# Patient Record
Sex: Male | Born: 1935 | Race: White | Hispanic: No | Marital: Married | Smoking: Never smoker
Health system: Southern US, Community
[De-identification: ages and names within clinical notes are randomized; demographics above are authoritative.]

## PROBLEM LIST (undated history)

## (undated) DIAGNOSIS — K5791 Diverticulosis of intestine, part unspecified, without perforation or abscess with bleeding: Secondary | ICD-10-CM

## (undated) DIAGNOSIS — D649 Anemia, unspecified: Secondary | ICD-10-CM

## (undated) DIAGNOSIS — C61 Malignant neoplasm of prostate: Secondary | ICD-10-CM

## (undated) DIAGNOSIS — R339 Retention of urine, unspecified: Secondary | ICD-10-CM

## (undated) DIAGNOSIS — N200 Calculus of kidney: Secondary | ICD-10-CM

## (undated) DIAGNOSIS — D494 Neoplasm of unspecified behavior of bladder: Secondary | ICD-10-CM

## (undated) DIAGNOSIS — I82409 Acute embolism and thrombosis of unspecified deep veins of unspecified lower extremity: Secondary | ICD-10-CM

## (undated) DIAGNOSIS — Z95828 Presence of other vascular implants and grafts: Secondary | ICD-10-CM

## (undated) DIAGNOSIS — R001 Bradycardia, unspecified: Secondary | ICD-10-CM

## (undated) DIAGNOSIS — F0390 Unspecified dementia without behavioral disturbance: Secondary | ICD-10-CM

## (undated) HISTORY — DX: Retention of urine, unspecified: R33.9

## (undated) HISTORY — DX: Malignant neoplasm of prostate: C61

## (undated) HISTORY — DX: Calculus of kidney: N20.0

## (undated) HISTORY — PX: BACK SURGERY: SHX140

## (undated) HISTORY — DX: Neoplasm of unspecified behavior of bladder: D49.4

---

## 1998-12-19 ENCOUNTER — Emergency Department (HOSPITAL_COMMUNITY): Admission: EM | Admit: 1998-12-19 | Discharge: 1998-12-19 | Payer: Self-pay | Admitting: Emergency Medicine

## 1998-12-19 ENCOUNTER — Encounter: Payer: Self-pay | Admitting: Emergency Medicine

## 1998-12-24 ENCOUNTER — Encounter: Admission: RE | Admit: 1998-12-24 | Discharge: 1998-12-24 | Payer: Self-pay | Admitting: Orthopedic Surgery

## 1998-12-24 ENCOUNTER — Encounter: Payer: Self-pay | Admitting: Orthopedic Surgery

## 1998-12-26 ENCOUNTER — Ambulatory Visit (HOSPITAL_BASED_OUTPATIENT_CLINIC_OR_DEPARTMENT_OTHER): Admission: RE | Admit: 1998-12-26 | Discharge: 1998-12-26 | Payer: Self-pay | Admitting: Orthopedic Surgery

## 2002-01-11 ENCOUNTER — Encounter: Payer: Self-pay | Admitting: Orthopedic Surgery

## 2002-01-13 ENCOUNTER — Encounter: Payer: Self-pay | Admitting: Orthopedic Surgery

## 2002-01-13 ENCOUNTER — Inpatient Hospital Stay (HOSPITAL_COMMUNITY): Admission: RE | Admit: 2002-01-13 | Discharge: 2002-01-18 | Payer: Self-pay | Admitting: Orthopedic Surgery

## 2002-04-19 ENCOUNTER — Encounter: Payer: Self-pay | Admitting: Orthopedic Surgery

## 2002-04-21 ENCOUNTER — Inpatient Hospital Stay (HOSPITAL_COMMUNITY): Admission: RE | Admit: 2002-04-21 | Discharge: 2002-04-25 | Payer: Self-pay | Admitting: Orthopedic Surgery

## 2002-04-21 ENCOUNTER — Encounter: Payer: Self-pay | Admitting: Orthopedic Surgery

## 2002-06-13 ENCOUNTER — Ambulatory Visit (HOSPITAL_COMMUNITY): Admission: RE | Admit: 2002-06-13 | Discharge: 2002-06-13 | Payer: Self-pay | Admitting: Ophthalmology

## 2002-08-15 ENCOUNTER — Ambulatory Visit (HOSPITAL_COMMUNITY): Admission: RE | Admit: 2002-08-15 | Discharge: 2002-08-15 | Payer: Self-pay | Admitting: Ophthalmology

## 2003-12-06 ENCOUNTER — Ambulatory Visit: Payer: Self-pay | Admitting: Oncology

## 2003-12-26 ENCOUNTER — Ambulatory Visit: Payer: Self-pay | Admitting: Oncology

## 2004-01-25 ENCOUNTER — Ambulatory Visit: Payer: Self-pay | Admitting: Oncology

## 2004-04-16 ENCOUNTER — Ambulatory Visit: Payer: Self-pay | Admitting: Oncology

## 2004-04-24 ENCOUNTER — Ambulatory Visit: Payer: Self-pay | Admitting: Oncology

## 2004-07-24 ENCOUNTER — Ambulatory Visit: Payer: Self-pay | Admitting: Oncology

## 2004-07-25 ENCOUNTER — Ambulatory Visit: Payer: Self-pay | Admitting: Oncology

## 2005-10-08 ENCOUNTER — Other Ambulatory Visit: Payer: Self-pay

## 2005-10-10 ENCOUNTER — Inpatient Hospital Stay: Payer: Self-pay | Admitting: Internal Medicine

## 2008-03-08 ENCOUNTER — Inpatient Hospital Stay: Payer: Self-pay | Admitting: Internal Medicine

## 2008-08-01 ENCOUNTER — Ambulatory Visit: Payer: Self-pay | Admitting: Unknown Physician Specialty

## 2008-11-24 ENCOUNTER — Ambulatory Visit: Payer: Self-pay | Admitting: Oncology

## 2008-12-10 ENCOUNTER — Inpatient Hospital Stay: Payer: Self-pay | Admitting: Internal Medicine

## 2008-12-25 ENCOUNTER — Ambulatory Visit: Payer: Self-pay | Admitting: Oncology

## 2009-01-23 ENCOUNTER — Ambulatory Visit: Payer: Self-pay | Admitting: Unknown Physician Specialty

## 2009-01-24 ENCOUNTER — Ambulatory Visit: Payer: Self-pay | Admitting: Internal Medicine

## 2009-01-25 ENCOUNTER — Inpatient Hospital Stay: Payer: Self-pay | Admitting: Unknown Physician Specialty

## 2009-02-09 ENCOUNTER — Inpatient Hospital Stay: Payer: Self-pay | Admitting: Student

## 2009-02-16 ENCOUNTER — Encounter: Payer: Self-pay | Admitting: Internal Medicine

## 2009-02-24 ENCOUNTER — Encounter: Payer: Self-pay | Admitting: Internal Medicine

## 2009-02-24 ENCOUNTER — Ambulatory Visit: Payer: Self-pay | Admitting: Internal Medicine

## 2009-03-27 ENCOUNTER — Encounter: Payer: Self-pay | Admitting: Internal Medicine

## 2009-07-30 ENCOUNTER — Ambulatory Visit: Payer: Self-pay | Admitting: Unknown Physician Specialty

## 2009-09-18 ENCOUNTER — Ambulatory Visit: Payer: Self-pay | Admitting: Urology

## 2009-10-02 ENCOUNTER — Ambulatory Visit: Payer: Self-pay | Admitting: Urology

## 2009-10-04 ENCOUNTER — Ambulatory Visit: Payer: Self-pay | Admitting: Urology

## 2009-10-18 ENCOUNTER — Ambulatory Visit: Payer: Self-pay | Admitting: Urology

## 2009-10-25 ENCOUNTER — Encounter: Payer: Self-pay | Admitting: Cardiovascular Disease

## 2009-10-25 ENCOUNTER — Ambulatory Visit: Payer: Self-pay | Admitting: Urology

## 2009-11-08 ENCOUNTER — Encounter: Payer: Self-pay | Admitting: Cardiovascular Disease

## 2009-11-08 ENCOUNTER — Ambulatory Visit: Payer: Self-pay | Admitting: Urology

## 2009-11-14 ENCOUNTER — Ambulatory Visit: Payer: Self-pay | Admitting: Cardiovascular Disease

## 2009-11-14 DIAGNOSIS — R42 Dizziness and giddiness: Secondary | ICD-10-CM

## 2009-11-14 DIAGNOSIS — I499 Cardiac arrhythmia, unspecified: Secondary | ICD-10-CM | POA: Insufficient documentation

## 2009-11-14 DIAGNOSIS — R0602 Shortness of breath: Secondary | ICD-10-CM | POA: Insufficient documentation

## 2009-11-15 ENCOUNTER — Encounter: Payer: Self-pay | Admitting: Cardiovascular Disease

## 2009-11-16 ENCOUNTER — Ambulatory Visit: Payer: Self-pay

## 2009-11-16 ENCOUNTER — Encounter: Payer: Self-pay | Admitting: Cardiovascular Disease

## 2009-11-27 ENCOUNTER — Encounter: Payer: Self-pay | Admitting: Cardiovascular Disease

## 2009-11-27 ENCOUNTER — Telehealth: Payer: Self-pay | Admitting: Cardiovascular Disease

## 2009-11-28 ENCOUNTER — Encounter: Payer: Self-pay | Admitting: Cardiovascular Disease

## 2009-11-30 ENCOUNTER — Telehealth: Payer: Self-pay | Admitting: Cardiovascular Disease

## 2010-03-28 NOTE — Procedures (Signed)
Summary: Holter and Event  Holter and Event   Imported By: Harlon Flor 02/28/2010 10:57:26  _____________________________________________________________________  External Attachment:    Type:   Image     Comment:   External Document

## 2010-03-28 NOTE — Letter (Signed)
Summary: [place] [employer] [facility], [facility] at [facility] [address]   [place], [place] [address]   Phone: [phone]  Fax: [phone]      November 28, 2009 MRN: [id number]   Cody Gordon [address] [place], [place]  [address]   Dear [name],  Your test ordered by Selena Batten has been reviewed by your physician (or physician assistant) and was found to be normal or stable. Your physician (or physician assistant) felt no changes were needed at this time.  __x__ Echocardiogram  ____ Cardiac Stress Test  ____ Lab Work  ____ Peripheral vascular study of arms, legs or neck  ____ CT scan or X-ray  ____ Lung or Breathing test  ____ Other:   Thank you.   Benedict Needy, RN    Dossie Arbour, MD

## 2010-03-28 NOTE — Assessment & Plan Note (Signed)
Summary: NP6/AMD   Visit Type:  Initial Consult Referring Yaneli Keithley:  Arlys Jaking Cope,M.D.  CC:  Establish care with a cardiologist.  "During a second lithotripsy and he had a significant sustained run of ventricular tachycardia".  He needs to have a stent removal..  History of Present Illness: 77 year old male, patient of Dr. Achilles Dunk with a history of kidney stones with recent arrhythmia noted during lithotripsy who presents for evaluation.  Notes from Dr. Achilles Dunk indicate that he had a run of ventricular tachycardia, then had bradycardia alternating with tachycardia during the procedure. He currently has a stent in his ureter with no significant discomfort.  At baseline, Mr. Smoak reports that he has episodes of dizziness over the past one to 2 weeks. Sometimes they happen with lying down, sometimes with getting up. He has to sit on the side of the bed for at least a minute for his symptoms to resolve. He reports having frequent falls as his legs give out. He has had worsening shortness of breath since his back surgery. He also states that his blood pressure has been running very low and is not on any medications. He denies any significant lower extremity edema or cough.  EKG shows normal sinus rhythm with rate of 69 beats per minute, right bundle branch block, no significant ST or T wave changes, APCs were noted  Preventive Screening-Counseling & Management  Alcohol-Tobacco     Smoking Status: never  Caffeine-Diet-Exercise     Does Patient Exercise: no  Current Medications (verified): 1)  None  Allergies (verified): No Known Drug Allergies  Past History:  Social History: Last updated: 11/14/2009 Retired  Married  Tobacco Use - No.  Alcohol Use - no Regular Exercise - no  Risk Factors: Exercise: no (11/14/2009)  Risk Factors: Smoking Status: never (11/14/2009)  Past Medical History: neoplasm,bladder prostate cancer tachycardia-bradycardia nephrolithiasis incomplete  emptying hematuria  Past Surgical History: back surgery  Social History: Retired  Married  Tobacco Use - No.  Alcohol Use - no Regular Exercise - no Smoking Status:  never Does Patient Exercise:  no  Review of Systems       The patient complains of dyspnea on exertion and difficulty walking.  The patient denies fever, weight loss, weight gain, vision loss, decreased hearing, hoarseness, chest pain, syncope, peripheral edema, prolonged cough, abdominal pain, incontinence, muscle weakness, depression, and enlarged lymph nodes.         near syncope, dizzy epsiodes  Vital Signs:  Patient profile:   77 year old male Height:      67 inches Weight:      171 pounds BMI:     26.88 Pulse rate:   69 / minute BP sitting:   100 / 78  (left arm) Cuff size:   regular  Vitals Entered By: Bishop Dublin, CMA (November 14, 2009 3:57 PM)  Physical Exam  General:  elderly gentleman in no apparent distress, walks with a cane. Head:  normocephalic and atraumatic Eyes:  PERRLA/EOM intact; conjunctiva and lids normal. Neck:  Neck supple, no JVD. No masses, thyromegaly or abnormal cervical nodes. Lungs:  Clear bilaterally to auscultation and percussion. Heart:  Non-displaced PMI, chest non-tender; regular rate and rhythm, S1, S2 with I-II/VI SEM RSB,  no rubs or gallops. Carotid upstroke normal, no bruit.  Pedals normal pulses. No edema, no varicosities. Abdomen:  abdomen soft and non-tender without masses Msk:  Back normal, normal gait. Muscle strength and tone normal. Pulses:  pulses normal in all 4 extremities Extremities:  No clubbing or cyanosis. Neurologic:  Alert and oriented x 3. Skin:  Intact without lesions or rashes. Psych:  Normal affect.   Impression & Recommendations:  Problem # 1:  CARDIAC ARRHYTHMIA (ICD-427.9) by report, he had a ventricular rhythm and then tachycardia with bradycardia during his lithotripsy procedure. The telemetry strips are not available for [place] to  review at this time. On auscultation in the clinic, he did have periods of bradycardia that seemed quite pronounced. We have ordered a Holter monitor for 48 hours and an echocardiogram to rule out structural disease. If his echocardiogram and Holter appear relatively benign, we will continue to watch him if symptoms persist, we may need an event monitor to run over a longer period of time.  I've encouraged him to stay hydrated as his blood pressure is borderline low and this may be contributing to some dizziness.  Orders: Echocardiogram (Echo) Holter (Holter)  Problem # 2:  SHORTNESS OF BREATH (ICD-786.05) Etiology of his shortness of breath is uncertain. Echocardiogram has been ordered to evaluate his function. No stress test has been ordered at this time.  Orders: Echocardiogram (Echo) Holter (Holter)  Problem # 3:  DIZZINESS (ICD-780.4) It is uncertain if his dizziness is due to arrhythmia or hypotension or some other etiology. We have asked him to stay hydrated, we will check his Holter monitor.  Orders: Echocardiogram (Echo) Holter (Holter)  Patient Instructions: 1)  Your physician recommends that you schedule a follow-up appointment in: 3 months  2)  Your physician has requested that you have an echocardiogram.  Echocardiography is a painless test that uses sound waves to create images of your heart. It provides your doctor with information about the size and shape of your heart and how well your heart's chambers and valves are working.  This procedure takes approximately one hour. There are no restrictions for this procedure. 3)  Your physician has recommended that you wear a holter monitor.  Holter monitors are medical devices that record the heart's electrical activity. Doctors most often use these monitors to diagnose arrhythmias. Arrhythmias are problems with the speed or rhythm of the heartbeat. The monitor is a small, portable device. You can wear one while you do your normal  daily activities. This is usually used to diagnose what is causing palpitations/syncope (passing out).  Appended Document: NP6/AMD ECHO is essentially benign. Holter shows periods of sinus tachycardia. Unable to start b-blocker as his blood pressure was low in the clinic. OK to proceed to stent removal by Dr. Achilles Dunk. Dr. Achilles Dunk could use low doses of metoprolol IV 2.5 to 5 mg for arrhythmia during procedure.   Appended Document: NP6/AMD note faxed to Dr. Wynn Maudlin office.

## 2010-03-28 NOTE — Letter (Signed)
Summary: Patient Receipt of NPP  Patient Receipt of NPP   Imported By: Marylou Mccoy 11/27/2009 13:54:27  _____________________________________________________________________  External Attachment:    Type:   Image     Comment:   External Document

## 2010-03-28 NOTE — Progress Notes (Signed)
Summary: STINT  Phone Note From Other Clinic Call back at [phone]   Caller: BRANDY FROM [facility] Call For: [facility] Summary of Call: [facility] WANTS TO KNOW IF THEY CAN GO AHEAD AND REMOVE THE PT'S STINT-PT IS IN THE OFFICE NOW Initial call taken by: Harlon Flor,  November 27, 2009 10:09 AM  Follow-up for Phone Call        note faxed to [facility] at [facility] 10/4  Follow-up by: Benedict Needy, RN,  November 27, 2009 10:59 AM

## 2010-03-28 NOTE — Letter (Signed)
Summary: [facility]  [facility]   Imported By: Harlon Flor 11/12/2009 16:26:40  _____________________________________________________________________  External Attachment:    Type:   Image     Comment:   External Document

## 2010-03-28 NOTE — Op Note (Signed)
Summary: Lithoptripsy  Lithoptripsy   Imported By: Harlon Flor 11/12/2009 15:21:54  _____________________________________________________________________  External Attachment:    Type:   Image     Comment:   External Document

## 2010-03-28 NOTE — Progress Notes (Signed)
Summary: Holter  Phone Note Outgoing Call   Call placed by: Benedict Needy, RN,  November 30, 2009 11:37 AM Call placed to: Patient Summary of Call: Called to make pt aware of holter results. Dr. Mariah Milling would like for pt to start monitoring BP's and HR's and call on monday. IF Bp's and HR can tolerate it he may be started on beta blocker for the day time tachycardia. Pt will call on monday with recordings over the weekend.  Initial call taken by: Benedict Needy, RN,  November 30, 2009 11:38 AM

## 2010-03-28 NOTE — Letter (Signed)
Summary: [facility] Office Visit Note   [facility] Office Visit Note   Imported By: Roderic Ovens 12/18/2009 09:20:23  _____________________________________________________________________  External Attachment:    Type:   Image     Comment:   External Document

## 2010-07-12 NOTE — Discharge Summary (Signed)
NAME:  Cody Gordon, Cody Gordon NO.:  [id number]   MEDICAL RECORD NO.:  [id number]                   PATIENT TYPE:  INP   LOCATION:  5009                                 FACILITY:  [facility]   PHYSICIAN:  Loreta Ave, M.D.              DATE OF BIRTH:  11-22-1920   DATE OF ADMISSION:  04/21/2002  DATE OF DISCHARGE:  04/25/2002                                 DISCHARGE SUMMARY   ADMISSION DIAGNOSIS:  Advanced degenerative joint disease of the right knee.   DISCHARGE DIAGNOSES:  1. Advanced degenerative joint disease of the right knee.  2. Hypertension.   PROCEDURE:  Right total knee replacement.   HISTORY:  A 77 year old male status post left total knee replacement with  good results.  He is now having pain and difficulty with his right knee.  Activity has worsened his pain.  He is having difficulties with his ADL's.  He was seen today and admitted for right total hip replacement.   HOSPITAL COURSE:  A 77 year old male admitted April 21, 2002 and after  appropriate laboratory studies were obtained, as well as 1 g of Ancef IV on-  call to the operating room, was taken to the operating room where he  underwent a right total knee replacement.  He tolerated the procedure well.  He was placed on an epidural for postoperative pain management.  Heparin  5000 units subcu q.12h. was started until his Coumadin became therapeutic.  Ancef 1 g IV q.8h. for three doses.  Consultation with PT, OT, and rehab  were made.  Ambulation and weightbearing as tolerated on the right.  CPM 0-  30 degrees eight hours per day and increased by 10 degrees per day.  His IV  fluids and Foley were discontinued on postoperative day #2.  Epidural was  also discontinued.  He did have problems with constipation and bisacodyl was  used for this management.  The remainder of his hospital course was  uneventful and he was actually discharged in improved condition on March 1,  to return back  to the office in 10 days for followup.  Radiographic studies  of April 21, 2002 reveal right knee with total knee replacement  prosthesis in expected position.  No evidence of fracture or dislocation.  Laboratory studies reveal a hemoglobin of 14.9, hematocrit 43.5%, white  count is 5600, platelets 209,000.  Discharge hemoglobin 11.9, hematocrit  33.7%.  Prothrombin time at time of discharge was 18.3 with an INR of 1.6.  Preop chemistries:  Sodium 140, potassium 3.8, chloride 107, CO2 25, glucose  102, BUN 13, creatinine 0.9, calcium 9.6, total protein 6.9, albumin 4.2,  AST 39, ALT 31, ALP 96, and total bilirubin 0.6.  Discharge sodium 138,  potassium 3.4, chloride 102, CO2 29, glucose 122, BUN 8, creatinine 0.7,  calcium 9.  Urinalysis revealed color red and turbid with large amount of  hemoglobin and moderate  bilirubin.  Ictotest was negative.  Protein was 100.  He had many epithelials, white 7-10, red too numerous to count, and bacteria  none seen.   DISCHARGE MEDICATIONS:  1. He was given a prescription for Percocet 5/325 one to two tablets q.4h.     p.r.n. pain.  2. Coumadin 5 mg as directed by Genevieve Norlander.  3. Colace 100 mg b.i.d.  4. Iron sulfate 325 mg b.i.d.  5. Lovenox 40 mg injection daily on Tuesday, Wednesday, and Thursday.   ACTIVITY:  As tolerated and physical therapy.  Follow total knee replacement  protocol.   DIET:  No restrictions on his diet.   WOUND CARE:  Change his dressing daily.  Keep the wound clean and dry.   DISCHARGE INSTRUCTIONS:  Call if he has any difficulty with increased  temperature, drainage, redness, or pain.   FOLLOW UP:  Follow up back up in the office in 10-14 days for a recheck  evaluation.  Also of note is that he did have prostate carcinoma accounting  for the urinalysis.  He is under the care of a urologist.     Oris Drone. Petrarca, P.A.-C.                Loreta Ave, M.D.    BDP/MEDQ  D:  05/30/2002  T:  05/31/2002  Job:   [id number]

## 2010-07-12 NOTE — Op Note (Signed)
   NAME:  Cody Gordon, Cody Gordon NO.:  [id number]   MEDICAL RECORD NO.:  [id number]                   PATIENT TYPE:  INP   LOCATION:  2550                                 FACILITY:  [facility]   PHYSICIAN:  Burna Forts, M.D.             DATE OF BIRTH:  [birth year]   DATE OF PROCEDURE:  04/21/2002  DATE OF DISCHARGE:                                 OPERATIVE REPORT   PREOPERATIVE DIAGNOSIS:  Degenerative joint disease of the knee.   POSTOPERATIVE DIAGNOSIS:  Degenerative joint disease of the knee.   OPERATION PERFORMED:  Right total knee replacement.   ANESTHESIA PROCEDURE:  Placement of epidural catheter for postoperative  analgesia.   DESCRIPTION OF PROCEDURE:  Preoperatively the risks and benefits of  placement of epidural catheter for postoperative analgesia were discussed  with the patient in detail including alternatives for pain control.  It has  been requested by Loreta Ave, M.D., that we placed this for his  postoperative analgesia care.  The patient consented to the placement of the  epidural catheter.   At the end of the operative procedure, the patient was turned to the left  lateral decubitus position.  A sterile prep of the lumbar area was conducted  in the area just above his previous lumbar laminectomy incision.  There was  a free space there at about the L2-3 level. With a paramedian approach with  a 17 gauge Tuohy needle, the epidural space was contacted with a loss of  resistance technique and a catheter threaded with ease approximately 3 to 4  cm beyond the needle tip and the needle was removed.  After negative  aspiration for both heme and CSF, the catheter was injected with a total of  6 ml of 0.25% Marcaine containing 100 mcg of fentanyl.  The catheter was  secured in place with tape, the patient turned supine, extubated and  transferred to the PACU.   COMPLICATIONS:  None.     Burna Forts, M.D.    JTM/MEDQ  D:  04/21/2002  T:  04/21/2002  Job:  [id number]

## 2010-07-12 NOTE — Op Note (Signed)
NAME:  Cody Gordon, Cody Gordon NO.:  [id number]   MEDICAL RECORD NO.:  [id number]                   PATIENT TYPE:  INP   LOCATION:  2550                                 FACILITY:  [facility]   PHYSICIAN:  Loreta Ave, M.D.              DATE OF BIRTH:  Dec 11, 1920   DATE OF PROCEDURE:  04/21/2002  DATE OF DISCHARGE:                                 OPERATIVE REPORT   PREOPERATIVE DIAGNOSIS:  End-stage degenerative arthritis right knee with  valgus alignment and lateral patellofemoral tracking.   POSTOPERATIVE DIAGNOSIS:  End-stage degenerative arthritis right knee with  valgus alignment and lateral patellofemoral tracking.   PROCEDURE:  Right total knee replacement utilizing Osteonix prosthesis, a  posterior cemented microstructured posterior stabilizer #7 femoral  component, cemented #9 tibial component with 10-mm PS polyethylene flex  insert, cemented recessed __________ 26 mm patellar component, soft tissue  balancing including lateral retinacular release.   SURGEON:  Loreta Ave, M.D.   ASSISTANT:  Arlys Aceson D. Petrarca, P.A.-C.   ANESTHESIA:  General.   ESTIMATED BLOOD LOSS:  Minimal.   TOURNIQUET TIME:  1 hour, 20 minutes.   SPECIMENS:  Excised bone with soft tissue. No cultures.   COMPLICATIONS:  None.   DRESSINGS:  Soft compressive with knee immobilizer.   DRAINS:  Hemovac x2.   PROCEDURE:  The patient was brought to the operating room and placed on the  operating table in the supine position. After adequate anesthesia had been  obtained the right knee was examined. Excessive valgus more than 10 degrees  correctable to about 7 degrees. Pseudolaxity from bony deficiency laterally.  Almost full extension with 130 degrees of flexion. Lateral patellofemoral  tracking and tethering. The tourniquet was applied. Prepped and draped in  the usual sterile fashion. Exsanguination with elevation of the Esmarch  tourniquet inflated to 350 mmHg.   A  straight incision above the patella down to the tibial tubercle. The skin  and subcutaneous tissues were divided. Hemostasis with cautery. A medial  parapatellar arthrotomy. Medial and lateral capsular releases to achieve a  balanced knee. Remnants of menisci and cruciate ligaments excised. Grade 4  changes worse on the lateral compartment.   The distal femur was exposed. The intramedullary guide was placed. Distal  cut and set at 5 degrees of valgus, removing 10 mm. Most of the bony  resection on the medial side. Sized for a #7 component.   The jig was put in place, definitive cuts made. Proximal tibia exposed with  appropriate retractors. The tibial spine was removed with a saw. Sized to a  #9 component. Proximal cut removing 4 to 5 mm off the lowest side which was  actually medial. A 5 degree posterior slope cut. Patella sized, reamed and  drilled for a 26-mm component. All jigs were removed.   The trial was put in place. A #7 on the femur, a #9 on the  tibia, a 26 on  the patella. With a 10-mm flexed insert and appropriate marking of rotation  on the tibial component which was then hand reamed, full extension, full  flexion, nicely balanced knee without liftoff on flexion. Lateral  patellofemoral tracking tethering. Lateral release performed with cautery  inside out.   After this had acceptable patellofemoral tracking all trials were removed.  Copious irrigation with a pulse irrigating device. Cement prepared and  placed on the tibial component which was hammered in place. Polyethylene  attached and securely fit.   The femoral components seated with posterior cement. Excessive cement  removed. Patellar component cemented in place in the patella. All excessive  cement removed. Copious irrigation with saline and antibiotic solution.   Once the cement had hardened the knee was reexamined. Good motion, good  stability, good patellofemoral tracking. A Hemovac was placed and brought   out through a separate stab wound.   The arthrotomy was closed with #1 Vicryl. The skin and subcutaneous tissues  with Vicryl with staples. The margins of the wound and knee injected with  Marcaine and Hemovac clamped. A sterile compressive dressing was applied.  The tourniquet was deflated and removed. The knee immobilizer was applied.   Anesthesia was reversed. Brought to the recovery room. Tolerated the surgery  well without complications.                                               Loreta Ave, M.D.    DFM/MEDQ  D:  04/21/2002  T:  04/21/2002  Job:  [id number]

## 2010-07-12 NOTE — Op Note (Signed)
NAME:  Cody Gordon, PACKETT NO.:  [id number]   MEDICAL RECORD NO.:  [id number]                   PATIENT TYPE:  INP   LOCATION:  2550                                 FACILITY:  [facility]   PHYSICIAN:  Daphene Calamity                       DATE OF BIRTH:  10-Jan-1921   DATE OF PROCEDURE:  01/13/2002  DATE OF DISCHARGE:                                 OPERATIVE REPORT   PREOPERATIVE DIAGNOSIS:  End-stage degenerative arthritis with valgus  alignment, left knee.   POSTOPERATIVE DIAGNOSIS:  End-stage degenerative arthritis with valgus  alignment, left knee.   OPERATIVE PROCEDURE:  Left total knee replacement with appropriate soft  tissue balancing including lateral retinacular release.  Press fit posterior  stabilizing, #7 femoral component.  Cemented #9 tibial component with 12 mm  posterior stabilizing flex polyethylene insert.  Cemented recess, non-metal  back 26 mm patellar component.   SURGEON:  Mckinley Jewel, M.D.   ASSISTANT:  Arlys Youssouf D. Petrarca, P.A.-C.   ANESTHESIA:  General.   BLOOD LOSS:  Minimal.   TOURNIQUET TIME:  One hour and 10 minutes.   SPECIMENS:  Excised bone and soft tissue.   CULTURES:  None.   COMPLICATIONS:  None.   DRESSINGS:  Sterile compressive with knee immobilizer.   DRAINS:  Hemovac times two.   DESCRIPTION OF PROCEDURE:  The patient was brought to the operating room and  placed on the operating room table in the supine position.  After adequate  anesthesia had been obtained, left knee examined.  Excessive valgus, almost  15 degrees correctable to about 7 degrees.  Stable ligaments.  Mild flexion  contracture.  Forward flexion 130 degrees.  Lateral patellar femoral  tracking and tethering.  Tourniquet applied, prepped and draped in the usual  sterile fashion.  Exsanguinated with elevation of Esmarch, tourniquet  inflated to 350 mmHg.  Stab incision above the patella down to the tibial  tubercle.  Medial parapatellar  arthrotomy, hemostasis with cautery.  Knee  exposed.  Grade 4 changes throughout.  Partial tearing anterior aspect deep  MCL off the femur, chronic.  Remnants of menisci, cruciate ligaments all  debrided.  Soft tissue release laterally to achieve the balanced knee,  including mostly intra-articular release.  Distal femur exposed.  Intramedullary guide placed. Distal cut 5 degrees of valgus removing 10 mm  although bone loss laterally, I still could use this approach.  Distal femur  sized and fitted with a #7 posterior stabilizing component after appropriate  cuts with appropriate jigs.  All spurs were removed including posterior  recesses.  Trial put in place and found to fit well, trial removed.  Tibia  exposed.  Tibial spine removed with saw, sized to #9 component.  Intramedullary guide placed.  A 5 degree posterior cut removed, 4 mm off the  lateral side. Once complete, the patella was then sized, reamed  and drilled  for a 26 mm component, trial put in place.  A #7 on the femur, #9 on the  tibia, 26 on the patella with the 12 mm insert on the tibia, had full  extension, full flexion, no lift off.  Nicely balanced knee after  appropriate soft tissue balancing.  Tibia was marked for appropriate  rotation and head reamed.  Patellofemoral tracking was assessed before  removal of trials and lateral release performed to balance patellofemoral  joint which was much improved in terms of tracking after lateral release.  All trials removed.  Copious irrigation with a pulse irrigating device.  Tibial component was cemented in place and polyethylene attached.  Femoral  components seated.  Knee reduced.  Wound irrigated.  Patellar component  cemented down into patella.  Once all extensive cement had been removed and  cement had hardened, the knee was re-examined.  Full extension, full  flexion, nicely balanced knee, set at 5 degrees of valgus.  Reasonable  patellofemoral tracking.  Hemovacs placed  and brought out through separate  stab wounds.  Arthrotomy closed with #1 Vicryl in the skin and subcutaneous  tissue with Vicryl and staples.  Once the wound was closed, the knee was  injected with Marcaine.  Hemovacs clamped.  Sterile compressive dressing  applied after tourniquet deflated and removed.  Knee immobilizer applied.  Anesthesia reversed, brought to recovery room.  Tolerated the surgery well,  no complications.                                               Daphene Calamity    JP/MEDQ  D:  01/13/2002  T:  01/13/2002  Job:  [id number]

## 2010-07-12 NOTE — Discharge Summary (Signed)
NAME:  Cody Gordon, Cody Gordon NO.:  [id number]   MEDICAL RECORD NO.:  [id number]                   PATIENT TYPE:  INP   LOCATION:  5032                                 FACILITY:  [facility]   PHYSICIAN:  Loreta Ave, M.D.              DATE OF BIRTH:  1920/10/14   DATE OF ADMISSION:  01/13/2002  DATE OF DISCHARGE:  01/18/2002                                 DISCHARGE SUMMARY   ADMISSION DIAGNOSIS:  Advanced degenerative joint disease of the left knee.   DISCHARGE DIAGNOSES:  1. Advanced degenerative joint disease of the left knee.  2. History of  hypertension.  3. Hypokalemia.   PROCEDURE:  Left total knee replacement.   HISTORY:  This 77 year old white male with advanced degenerative joint  disease of the left knee.  He continues with popping and deep sharp pain in  the left knee.  He has failed with conservative treatment.  He has end stage  radiographic degenerative joint disease.  He is now indicated for left total  knee replacement.   HOSPITAL COURSE:  This 77 year old male admitted 01/13/2002 after  appropriate laboratory studies were obtained as well as 1 gram of Ancef  intravenously on call to the operating room.  He was taken to the operating  room where he underwent a left total knee replacement.  A Foley catheter was  placed preoperatively.  He was placed on a Dilaudid pump for postoperative  pain management.  Ancef was continued at 1 gram intravenously q8h times  three doses.  Heparin 5,000 units subcu q12 hours was started until his  Coumadin became therapeutic.  Consultation with PT, OT, and rehabilitation  were made.  Ambulation with weight bearing as tolerated on the left.  CPM 0-  30 degrees for eight hours per day.  Postop day two his Foley catheter was  discontinued and his intravenous was Hep-Lock.  He was placed on potassium  20 mEq p.o. b.i.d. for one day.  The remainder of his hospital course was  uneventful except for  constipation and his hypokalemia.  This was corrected  by the time of his discharge.  He was discharged on the 25th to return back  to the office in ten days for followup.   LABORATORY DATA:  EKG showed normal sinus rhythm.  Radiographic studies on  01/13/2002:  Left knee reveals anatomic alignment status post left total  knee arthroplasty without complications  Chest x-ray revealed no active  disease.  Laboratory studies:  Admitted with a hemoglobin 17.2, hematocrit  49.5%, white count 8,800 platelet count 228,000, discharge hemoglobin 13.4,  hematocrit 39.65, white count 8,900, platelet count 205,000.  Discharge pro-  time was 24.4, with INR 2.6.  Preop chemistries:  Sodium 139, potassium 4.0,  chloride 100, CO2 29, glucose 98, BUN 14, creatinine 0.9, calcium 10.1,  total protein 7.5, albumin 4.4, AST 31, ALT 31, ALP 108, total bilirubin  0.8.  Discharge sodium 134, potassium 3.4, chloride 92, CO2 31, glucose 148,  BUN 19, creatinine 0.9, and calcium 92.  Urinalysis revealed moderate blood  with too numerous to count red cells.  White count was 3-6 only.  Trace  leucocyte esterase.  Bacteria none seen.   DISCHARGE INSTRUCTIONS:  1. He was given a prescription for Percocet 5/325 one to two tablets every     four hours as needed for pain.  2. Coumadin 5 mg as directed by Genevieve Norlander pharmacist.  3. Colace 100 mg one tablet twice a day.  4. Iron sulfate 325 mg one tablet daily.  5. Activities as tolerated with physical therapy.  6. No restrictions in his diet.  7. Keep his wound clean and dry.  8. Call if he has any problems with his wound or increase in temperature,     pain, drainage or swelling.  9. He will follow back up with [place] in ten days for recheck evaluation.     Oris Drone Petrarca, P.A.-C.                Loreta Ave, M.D.    BDP/MEDQ  D:  03/13/2002  T:  03/14/2002  Job:  [id number]

## 2010-09-18 ENCOUNTER — Encounter: Payer: Self-pay | Admitting: Cardiovascular Disease

## 2011-10-02 IMAGING — CT CT CHEST W/ CM
1 series · 15 of 32 positions shown, 19 images · non-contrast
Comparison: None

REASON FOR EXAM: CHEST PAIN
COMMENTS:

PROCEDURE:     CT  - CT CHEST (FOR PE) W  - December 09, 2008 [DATE]
RESULT:     Indications: Chest pain
TECHNIQUE: A thin-section spiral CT from the lung apices to the upper
abdomen was acquired on a multi slice scanner following 100 ml Msovue-2L5
intravenous contrast. These images were then transferred to the Siemens work
station and were subsequently reviewed utilizing 3-D reconstructions and MIP
images.

[Series 4: soft tissue · axial · 0.77mm/px · z∈[-520,-268]mm · 15 of 96 slices shown, 19 images]
[im 8/96  mediastinal]
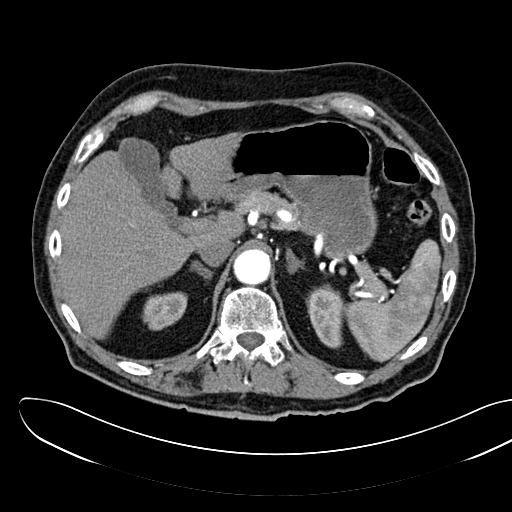
[im 8/96  lung]
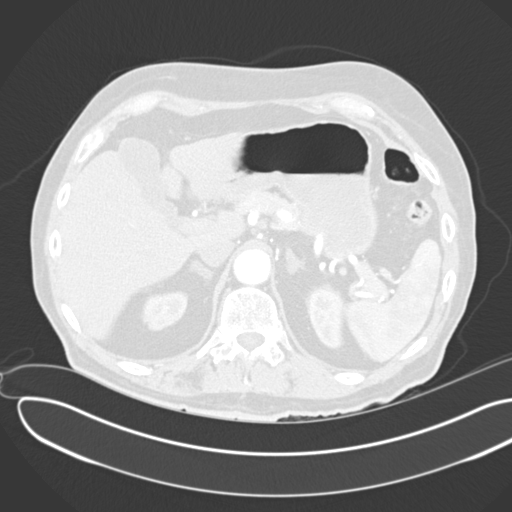
[im 15/96  lung]
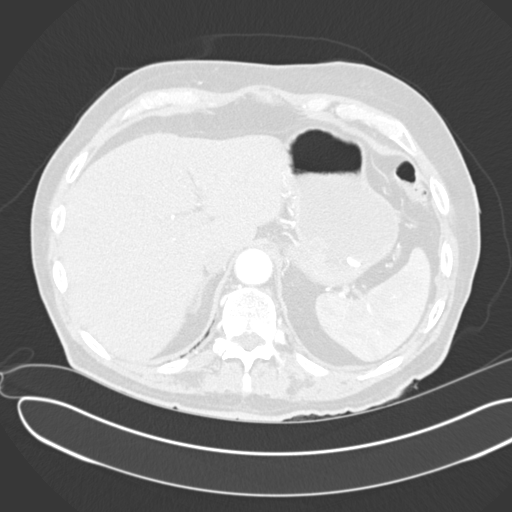
[im 20/96  lung]
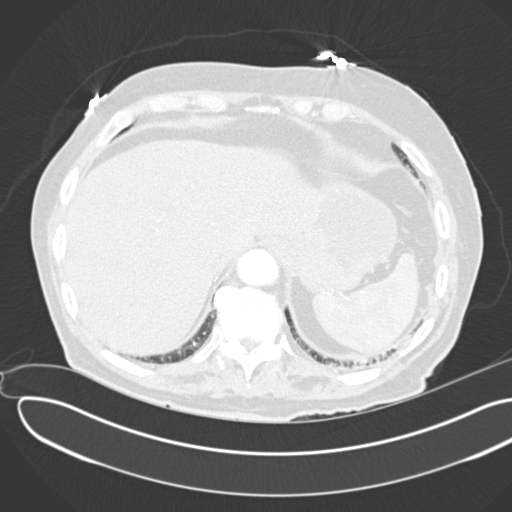
[im 25/96  lung]
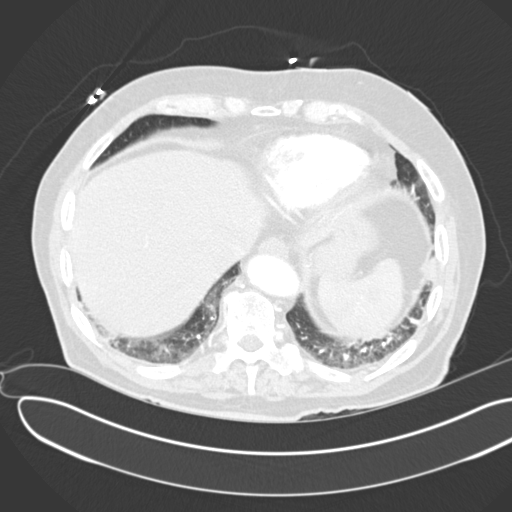
[im 32/96  mediastinal]
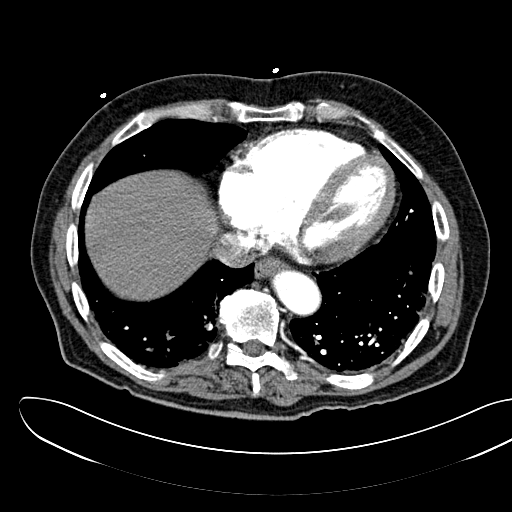
[im 32/96  lung]
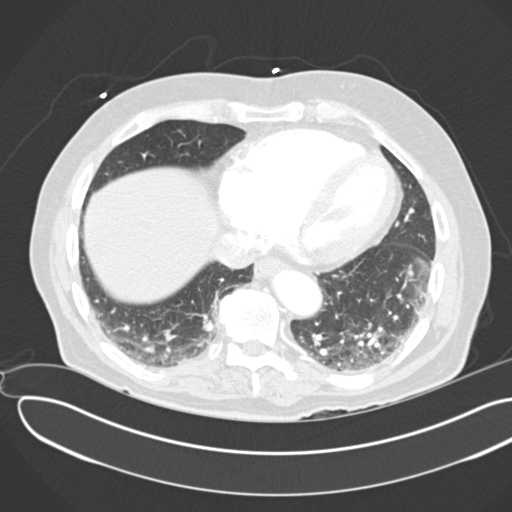
[im 39/96  lung]
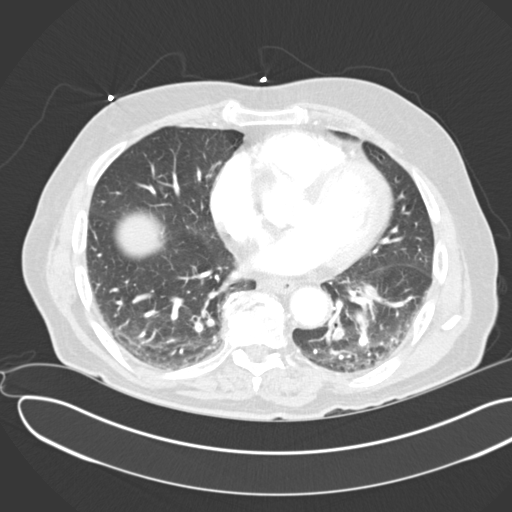
[im 46/96  lung]
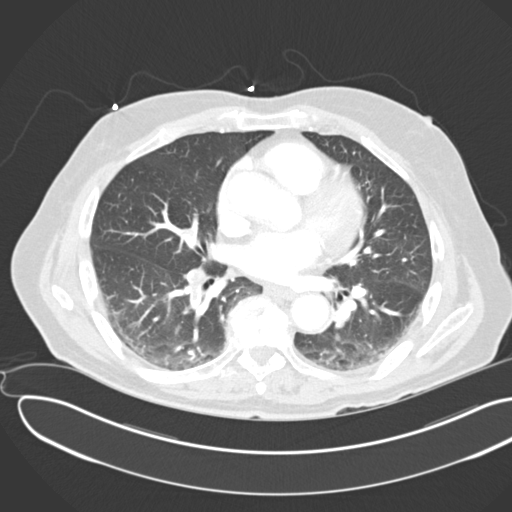
[im 51/96  lung]
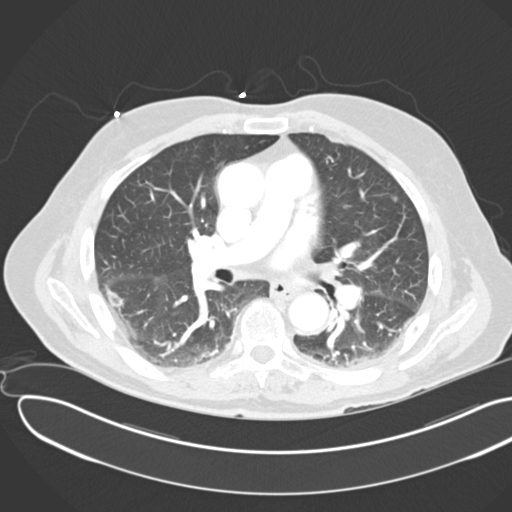
[im 57/96  mediastinal]
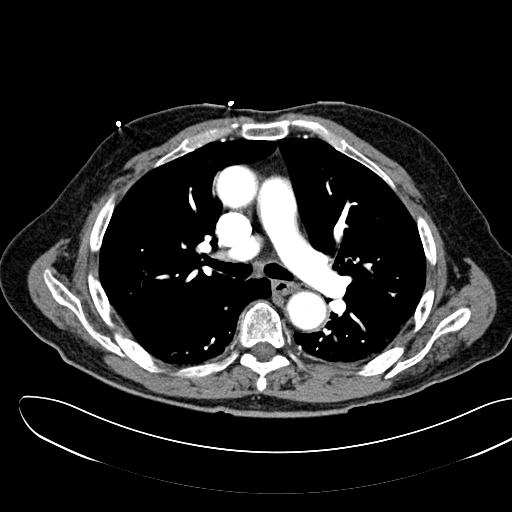
[im 57/96  lung]
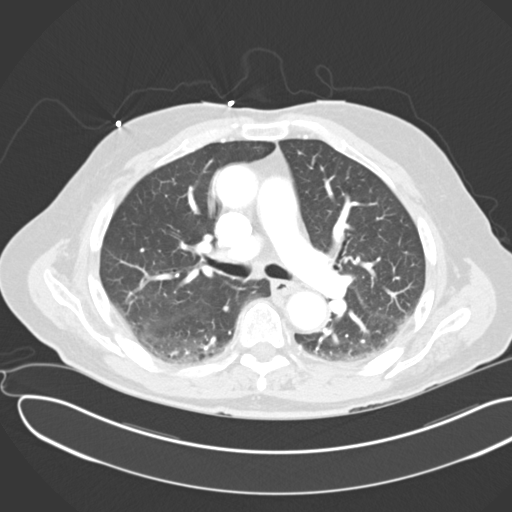
[im 60/96  lung]
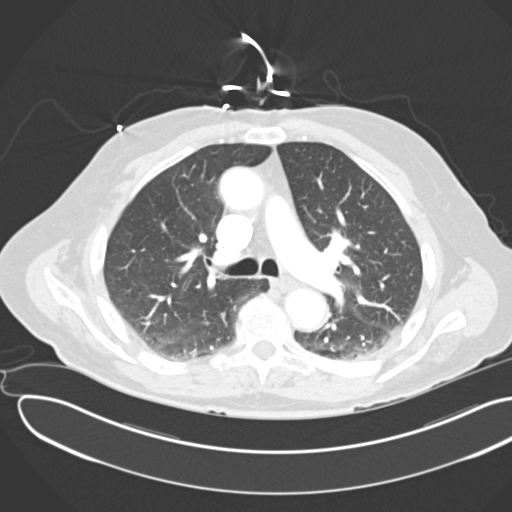
[im 67/96  lung]
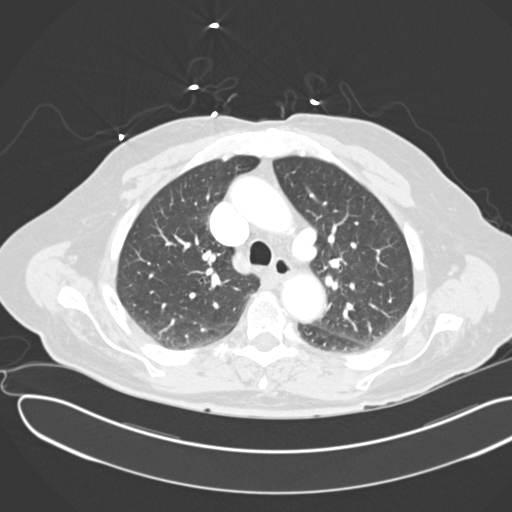
[im 74/96  lung]
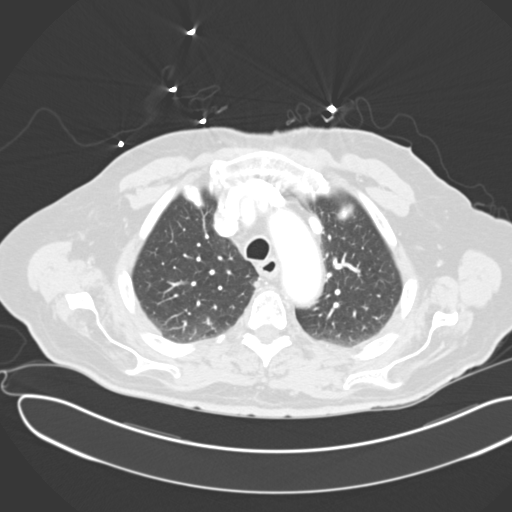
[im 78/96  mediastinal]
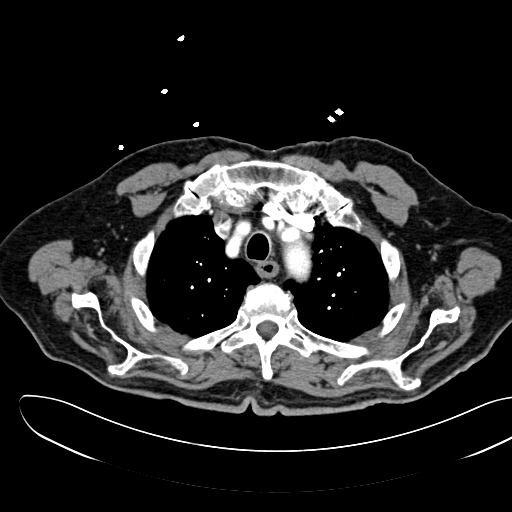
[im 78/96  lung]
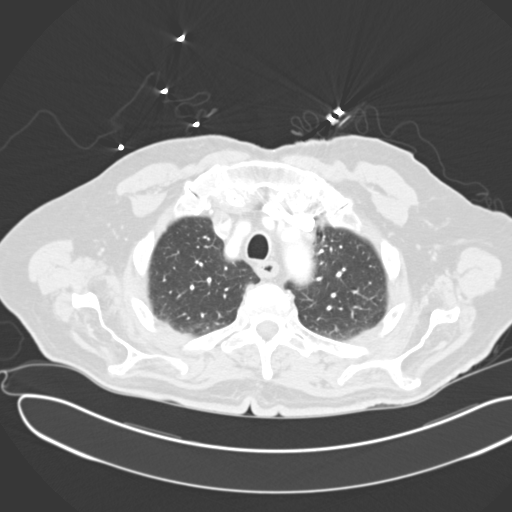
[im 85/96  lung]
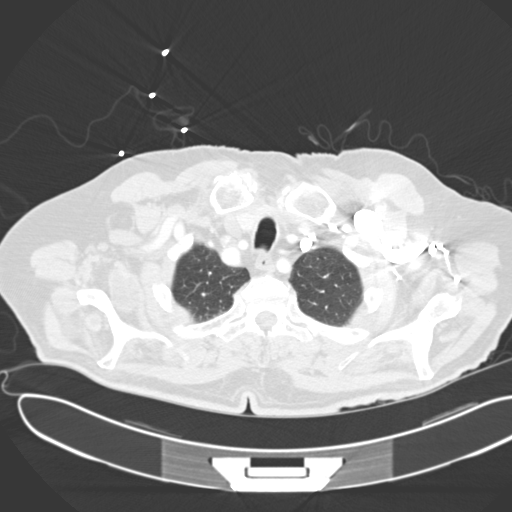
[im 92/96  lung]
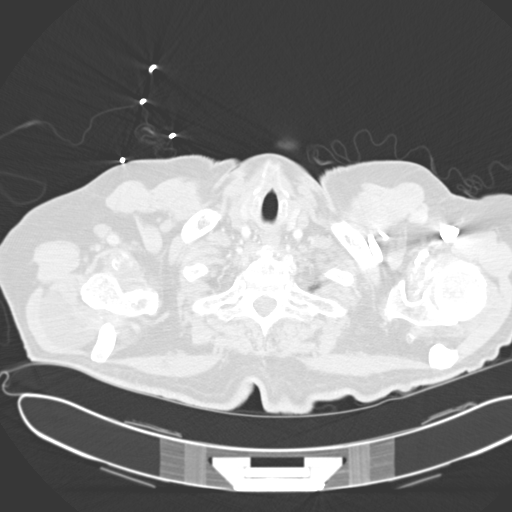

[15 of 32 positions shown; findings below may reference images not displayed]

FINDINGS: There is adequate opacification of the pulmonary arteries. There are
pulmonary emboli in the lobar and segmental branches of the right middle,
right upper lobe and right lower lobe. There are pulmonary emboli in the
subsegmental branches of the left lower common right upper, lower, and
middle lobes. The main pulmonary artery, right main pulmonary artery, and
left main pulmonary arteries are normal in size. The heart size is normal.
There is no pericardial effusion.

There is bibasilar atelectasis. There is no focal consolidation, pleural
effusion, or pneumothorax.

There is no axillary, hilar, or mediastinal adenopathy.

There are severe degenerative changes of bilateral glenohumeral joints.
There is a large right glenohumeral joint effusion. There is right
subacromial/subdeltoid bursitis.

There is a 1 cm hypodense nodule in the right thyroid gland.

There is mild prominence of the right upper pole calyx which may represent
mild hydronephrosis, but the area is incompletely evaluated.
IMPRESSION: 1. There are pulmonary emboli involving the lobar and segmental branches of
the right lung and the subsegmental branches of the left lung.

2. Severe bilateral glenohumeral degenerative joint disease. There is a
large right parenchymal joint effusion with possible loose body. There is
right subacromial/subdeltoid bursitis.

3. Incidental note made of a 1 cm hypodense mass in the right thyroid gland.
Recommend a thyroid ultrasound for further evaluation.

These findings were communicated to Dr. Maleek on 12/10/2008 at 3363 hours EST.

## 2011-10-03 IMAGING — US US EXTREM LOW VENOUS BILAT
1 series · 17 of 24 positions shown · non-contrast
Comparison: none

REASON FOR EXAM: B/L PE
COMMENTS:

PROCEDURE:     US  - US DOPPLER LOW EXTR BILATERAL  - December 10, 2008  [DATE]
RESULT:     Comparison: None

[Series 1: us extrem low venous bilat · 17 of 59 slices shown]
[im 1/59]
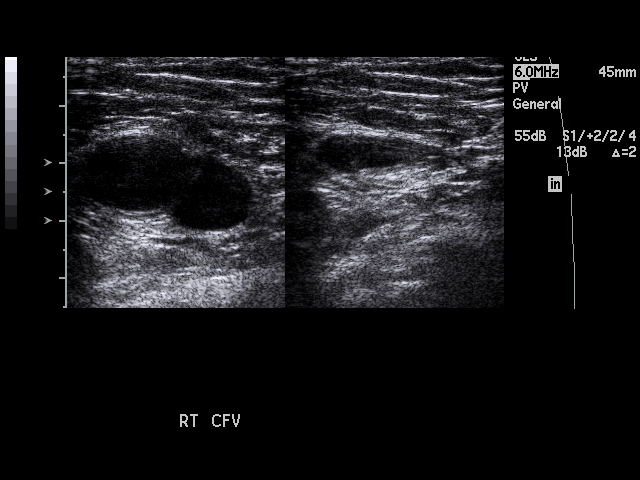
[im 6/59]
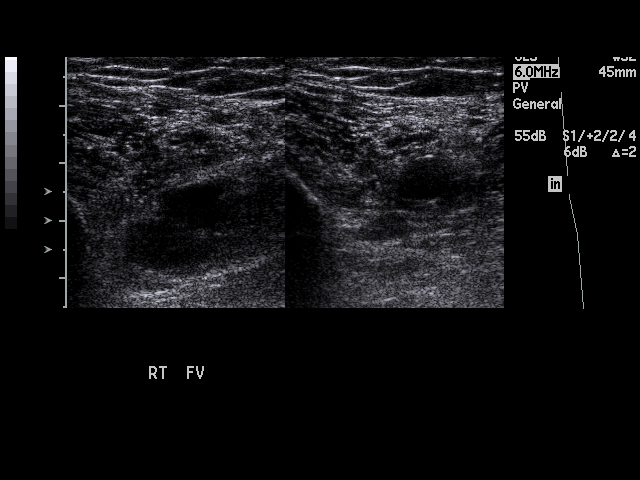
[im 8/59]
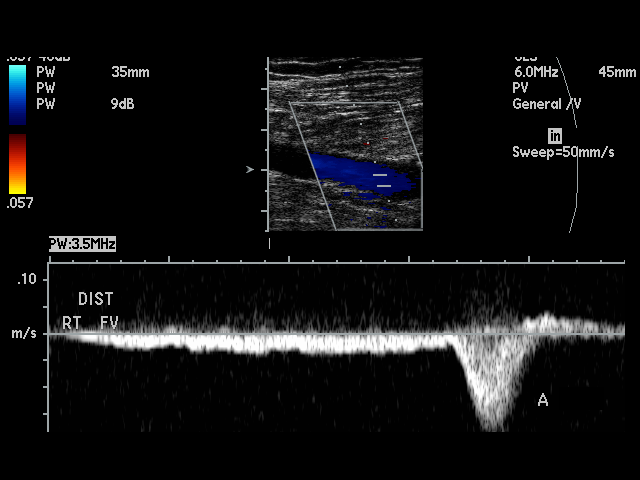
[im 11/59]
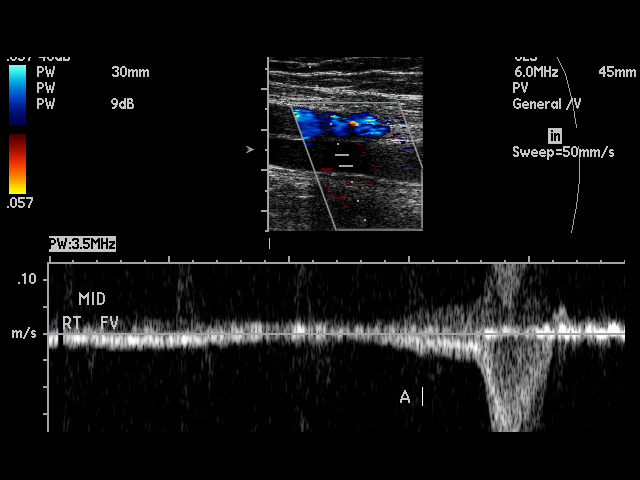
[im 16/59]
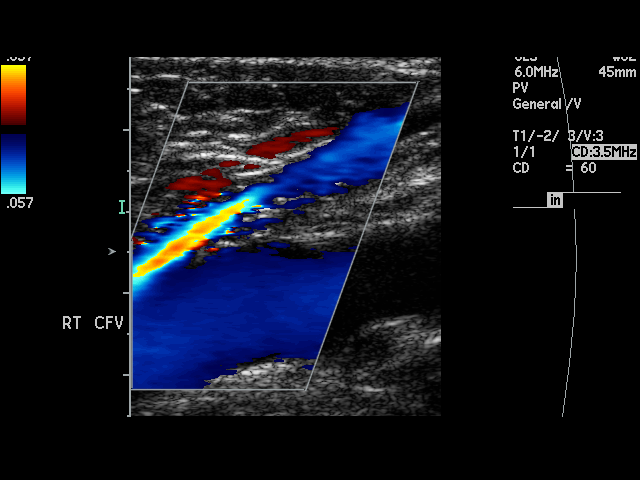
[im 18/59]
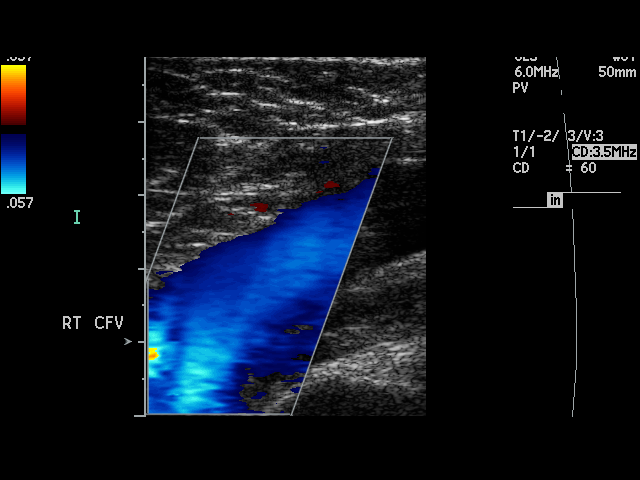
[im 23/59]
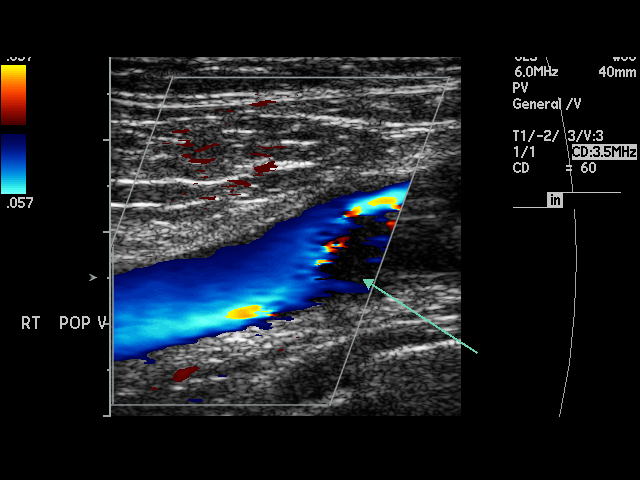
[im 26/59]
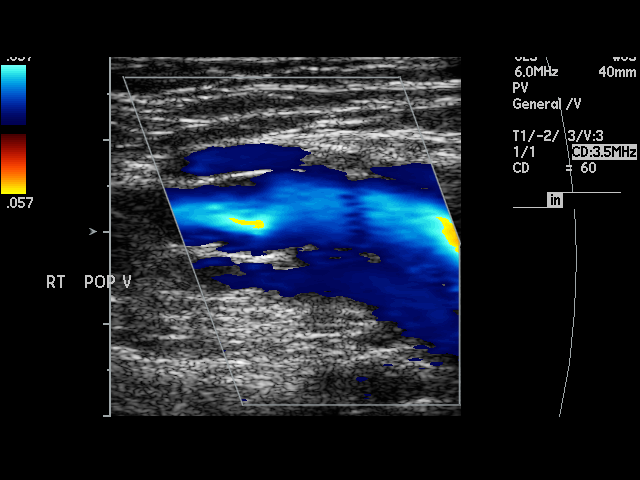
[im 31/59]
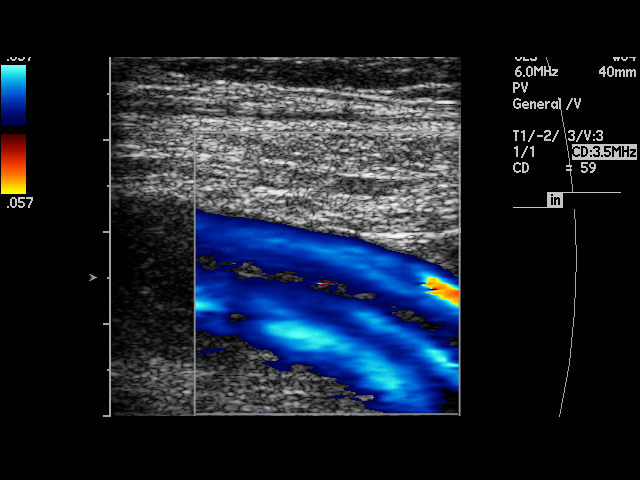
[im 33/59]
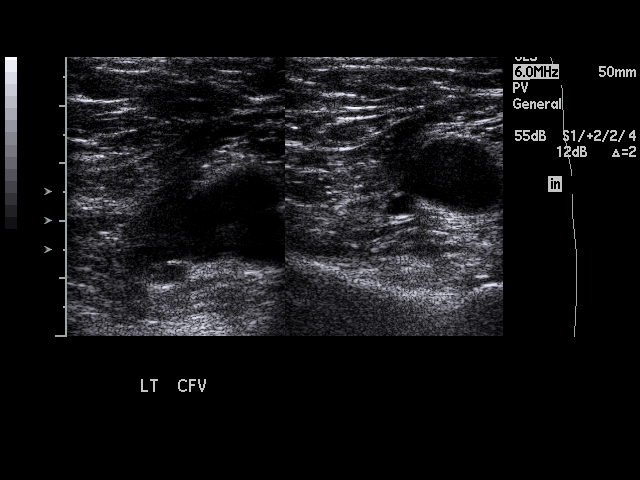
[im 36/59]
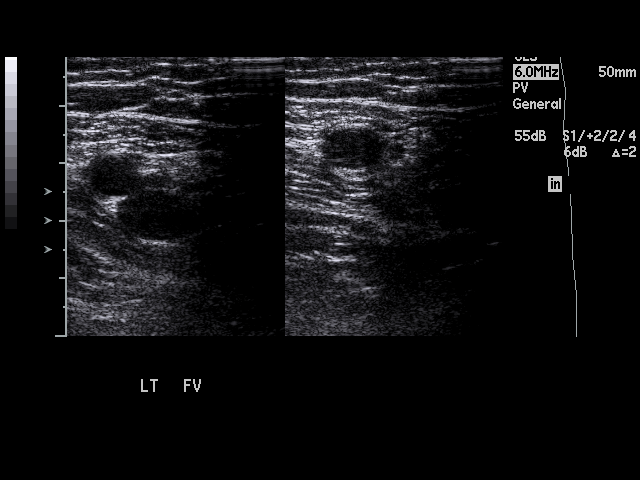
[im 41/59]
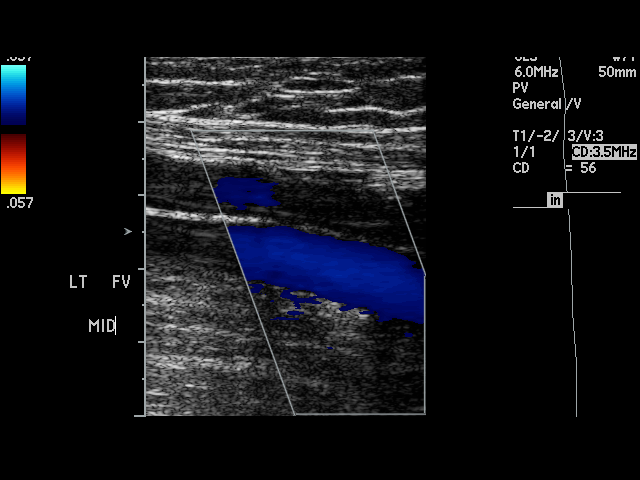
[im 43/59]
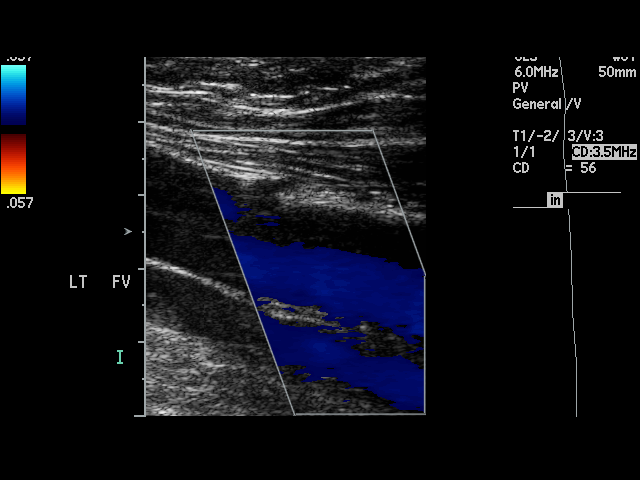
[im 48/59]
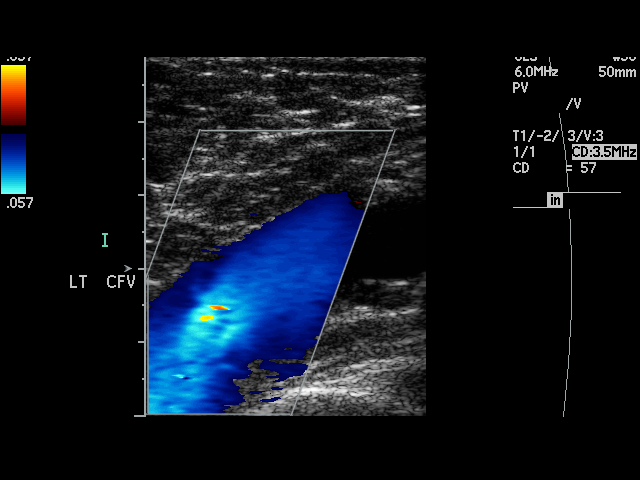
[im 51/59]
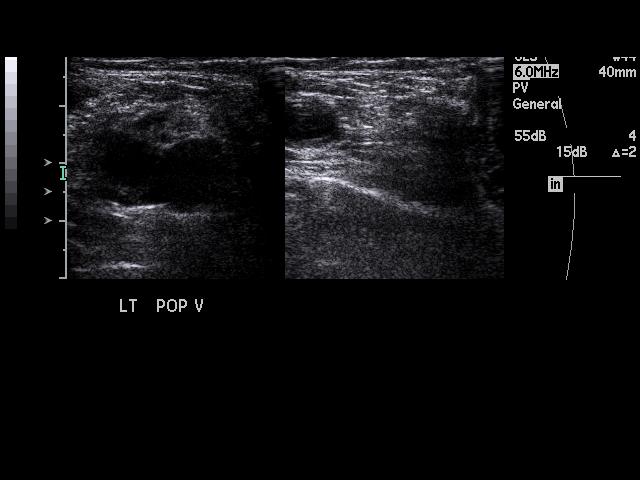
[im 53/59]
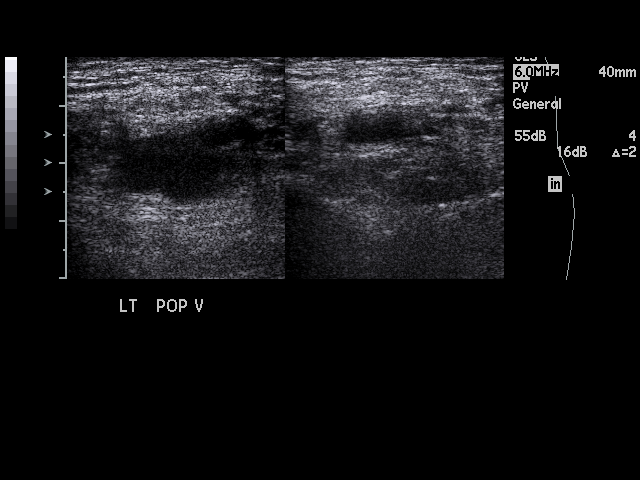
[im 59/59]
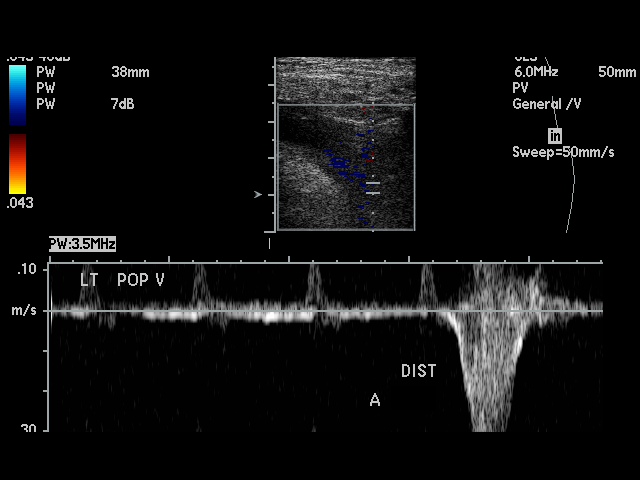

[17 of 24 positions shown; findings below may reference images not displayed]

FINDINGS: Multiple longitudinal and transverse gray-scale as well as color
and spectral Doppler images of  bilateral lower extremity veins were
obtained from the common femoral veins through the popliteal veins.

The right common femoral, greater saphenous, femoral are patent,
demonstrating normal color-flow and compressibility. No intraluminal
thrombus is identified.There is normal respiratory variation and
augmentation demonstrated at all vein levels. The right popliteal vein is
noncompressible. There is color flow seen through the popliteal vein
consistent with a nonocclusive thrombus.

The left common femoral, greater saphenous, femoral, popliteal veins, and
venous trifurcation are patent, demonstrating normal color-flow and
compressibility. No intraluminal thrombus is identified.There is normal
respiratory variation and augmentation demonstrated at all vein levels.
IMPRESSION: 1. Nonocclusive deep venous thrombosis of the right popliteal vein.
2. No evidence of DVT in the left lower extremity.

## 2013-03-21 ENCOUNTER — Emergency Department: Payer: Self-pay | Admitting: Emergency Medicine

## 2013-03-28 ENCOUNTER — Emergency Department: Payer: Self-pay | Admitting: Emergency Medicine

## 2013-12-25 ENCOUNTER — Ambulatory Visit: Payer: Self-pay | Admitting: Internal Medicine

## 2014-01-14 ENCOUNTER — Inpatient Hospital Stay: Payer: Self-pay | Admitting: Internal Medicine

## 2014-01-14 LAB — COMPREHENSIVE METABOLIC PANEL
ALBUMIN: 3.3 g/dL — AB (ref 3.4–5.0)
ALT: 18 U/L
ANION GAP: 11 (ref 7–16)
Alkaline Phosphatase: 63 U/L
BILIRUBIN TOTAL: 0.7 mg/dL (ref 0.2–1.0)
BUN: 25 mg/dL — ABNORMAL HIGH (ref 7–18)
CO2: 24 mmol/L (ref 21–32)
Calcium, Total: 8.4 mg/dL — ABNORMAL LOW (ref 8.5–10.1)
Chloride: 109 mmol/L — ABNORMAL HIGH (ref 98–107)
Creatinine: 1.26 mg/dL (ref 0.60–1.30)
EGFR (African American): >60
GFR CALC NON AF AMER: 57 — AB
Glucose: 148 mg/dL — ABNORMAL HIGH (ref 65–99)
Osmolality: 294 (ref 275–301)
Potassium: 4 mmol/L (ref 3.5–5.1)
SGOT(AST): 21 U/L (ref 15–37)
Sodium: 144 mmol/L (ref 136–145)
TOTAL PROTEIN: 5.8 g/dL — AB (ref 6.4–8.2)

## 2014-01-14 LAB — CBC WITH DIFFERENTIAL/PLATELET
Basophil #: 0.1 10*3/uL (ref 0.0–0.1)
Basophil %: 0.5 %
Eosinophil #: 0.1 10*3/uL (ref 0.0–0.7)
Eosinophil %: 1.5 %
HCT: 31.2 % — ABNORMAL LOW (ref 40.0–52.0)
HGB: 10.5 g/dL — ABNORMAL LOW (ref 13.0–18.0)
Lymphocyte #: 1.3 10*3/uL (ref 1.0–3.6)
Lymphocyte %: 13 %
MCH: 33.3 pg (ref 26.0–34.0)
MCHC: 33.7 g/dL (ref 32.0–36.0)
MCV: 99 fL (ref 80–100)
Monocyte #: 0.5 x10 3/mm (ref 0.2–1.0)
Monocyte %: 5.2 %
Neutrophil #: 7.8 10*3/uL — ABNORMAL HIGH (ref 1.4–6.5)
Neutrophil %: 79.8 %
Platelet: 167 10*3/uL (ref 150–440)
RBC: 3.16 10*6/uL — ABNORMAL LOW (ref 4.40–5.90)
RDW: 14.3 % (ref 11.5–14.5)
WBC: 9.7 10*3/uL (ref 3.8–10.6)

## 2014-01-14 LAB — TROPONIN I: TROPONIN-I: 0.02 ng/mL

## 2014-01-14 LAB — PROTIME-INR
INR: 1.3
PROTHROMBIN TIME: 15.9 s — AB (ref 11.5–14.7)

## 2014-01-14 LAB — HEMOGLOBIN: HGB: 9.3 g/dL — AB (ref 13.0–18.0)

## 2014-01-15 LAB — CBC WITH DIFFERENTIAL/PLATELET
BASOS PCT: 0.2 %
Basophil #: 0 10*3/uL (ref 0.0–0.1)
Eosinophil #: 0 10*3/uL (ref 0.0–0.7)
Eosinophil %: 0.1 %
HCT: 22.4 % — ABNORMAL LOW (ref 40.0–52.0)
HGB: 7.6 g/dL — ABNORMAL LOW (ref 13.0–18.0)
Lymphocyte #: 0.9 10*3/uL — ABNORMAL LOW (ref 1.0–3.6)
Lymphocyte %: 9 %
MCH: 33.1 pg (ref 26.0–34.0)
MCHC: 33.8 g/dL (ref 32.0–36.0)
MCV: 98 fL (ref 80–100)
Monocyte #: 0.8 x10 3/mm (ref 0.2–1.0)
Monocyte %: 7.7 %
Neutrophil #: 8.6 10*3/uL — ABNORMAL HIGH (ref 1.4–6.5)
Neutrophil %: 83 %
PLATELETS: 171 10*3/uL (ref 150–440)
RBC: 2.28 10*6/uL — AB (ref 4.40–5.90)
RDW: 14.5 % (ref 11.5–14.5)
WBC: 10.4 10*3/uL (ref 3.8–10.6)

## 2014-01-15 LAB — HEMOGLOBIN
HGB: 8.1 g/dL — ABNORMAL LOW (ref 13.0–18.0)
HGB: 8.1 g/dL — ABNORMAL LOW (ref 13.0–18.0)

## 2014-01-15 LAB — MAGNESIUM: MAGNESIUM: 1.7 mg/dL — AB

## 2014-01-15 LAB — BASIC METABOLIC PANEL
Anion Gap: 8 (ref 7–16)
BUN: 24 mg/dL — ABNORMAL HIGH (ref 7–18)
Calcium, Total: 8.1 mg/dL — ABNORMAL LOW (ref 8.5–10.1)
Chloride: 112 mmol/L — ABNORMAL HIGH (ref 98–107)
Co2: 26 mmol/L (ref 21–32)
Creatinine: 1.01 mg/dL (ref 0.60–1.30)
EGFR (Non-African Amer.): >60
Glucose: 129 mg/dL — ABNORMAL HIGH (ref 65–99)
Osmolality: 296 (ref 275–301)
Potassium: 4.1 mmol/L (ref 3.5–5.1)
SODIUM: 146 mmol/L — AB (ref 136–145)

## 2014-01-15 LAB — OCCULT BLOOD X 1 CARD TO LAB, STOOL: Occult Blood, Feces: POSITIVE

## 2014-01-16 LAB — HEMOGLOBIN
HGB: 7 g/dL — ABNORMAL LOW (ref 13.0–18.0)
HGB: 8.4 g/dL — ABNORMAL LOW (ref 13.0–18.0)

## 2014-01-17 LAB — HEMOGLOBIN: HGB: 7.7 g/dL — AB (ref 13.0–18.0)

## 2014-01-18 LAB — HEMOGLOBIN: HGB: 8.6 g/dL — ABNORMAL LOW (ref 13.0–18.0)

## 2014-01-19 LAB — MAGNESIUM: Magnesium: 1.9 mg/dL

## 2014-01-19 LAB — BASIC METABOLIC PANEL
Anion Gap: 6 — ABNORMAL LOW (ref 7–16)
BUN: 17 mg/dL (ref 7–18)
CHLORIDE: 112 mmol/L — AB (ref 98–107)
CREATININE: 1.07 mg/dL (ref 0.60–1.30)
Calcium, Total: 8 mg/dL — ABNORMAL LOW (ref 8.5–10.1)
Co2: 27 mmol/L (ref 21–32)
EGFR (African American): >60
Glucose: 99 mg/dL (ref 65–99)
OSMOLALITY: 290 (ref 275–301)
Potassium: 3.5 mmol/L (ref 3.5–5.1)
SODIUM: 145 mmol/L (ref 136–145)

## 2014-01-24 ENCOUNTER — Ambulatory Visit: Payer: Self-pay | Admitting: Internal Medicine

## 2014-03-18 ENCOUNTER — Emergency Department: Payer: Self-pay | Admitting: Emergency Medicine

## 2014-06-17 NOTE — Consult Note (Signed)
Chief Complaint:  Subjective/Chief Complaint seen for hematochezia.  several episodes of repeat bleeding.   some disorientation last night and today.  denies abdominal pain or nausea.  several episodes of hematochezia overnight. hemodynamically stable.   VITAL SIGNS/ANCILLARY NOTES: **Vital Signs.:   22-Nov-15 08:12  Vital Signs Type Q 8hr  Temperature Temperature (F) 97.9  Celsius 36.6  Temperature Source oral  Pulse Pulse 77  Respirations Respirations 18  Systolic BP Systolic BP 208  Diastolic BP (mmHg) Diastolic BP (mmHg) 69  Mean BP 86  Pulse Ox % Pulse Ox % 96  Pulse Ox Activity Level  At rest  Oxygen Delivery Room Air/ 21 %  *Intake and Output.:   22-Nov-15 04:00  Stool  liquid dark red stool    04:48  Stool  moderate amount of thickened liquid dark red stool    11:09  Stool  bloody stool   Brief Assessment:  Cardiac Regular   Respiratory clear BS   Gastrointestinal details normal Soft  Nontender  Nondistended  No masses palpable  Bowel sounds normal   Lab Results:  Routine Chem:  21-Nov-15 13:43   BUN  25  22-Nov-15 01:14   Glucose, Serum  129  BUN  24  Creatinine (comp) 1.01  Sodium, Serum  146  Potassium, Serum 4.1  Chloride, Serum  112  CO2, Serum 26  Calcium (Total), Serum  8.1  Anion Gap 8  Osmolality (calc) 296  eGFR (African American) >60  eGFR (Non-African American) >60 (eGFR values <45m/min/1.73 m2 may be an indication of chronic kidney disease (CKD). Calculated eGFR, using the MRDR Study equation, is useful in  patients with stable renal function. The eGFR calculation will not be reliable in acutely ill patients when serum creatinine is changing rapidly. It is not useful in patients on dialysis. The eGFR calculation may not be applicable to patients at the low and high extremes of body sizes, pregnant women, and vegetarians.)  Magnesium, Serum  1.7 (1.8-2.4 THERAPEUTIC RANGE: 4-7 mg/dL TOXIC: > 10 mg/dL  -----------------------)   Routine Sero:  21-Nov-15 23:50   Occult Blood, Feces POSITIVE (Result(s) reported on 15 Jan 2014 at 12:31AM.)  Routine Hem:  21-Nov-15 13:43   Hemoglobin (CBC)  10.5    17:36   Hemoglobin (CBC)  9.3 (Result(s) reported on 14 Jan 2014 at 05:55PM.)  22-Nov-15 01:14   Hemoglobin (CBC)  8.1 (Result(s) reported on 15 Jan 2014 at 01:56AM.)    11:21   Hemoglobin (CBC)  8.1 (Result(s) reported on 15 Jan 2014 at 11:55AM.)   Assessment/Plan:  Assessment/Plan:  Assessment 1) hematochezia.  patient hgb at 1am and 1130am that were the same.   2) AMS   Plan 1) continue serial hgb.  transfuse as needed.  Will order gi bleeding scan if further/repeat bleeding.  following.   Electronic Signatures: SLoistine Simas(MD)  (Signed [id number])  Authored: Chief Complaint, VITAL SIGNS/ANCILLARY NOTES, Brief Assessment, Lab Results, Assessment/Plan   Last Updated: 22-Nov-15 12:43 by SLoistine Simas(MD)

## 2014-06-17 NOTE — Consult Note (Signed)
Brief Consult Note: Diagnosis: hematochezia.   Patient was seen by consultant.   Consult note dictated.   Recommend further assessment or treatment.   Comments: Please see full Gi consult.   Patient admitted with hematochezia.  Several episodes at home over the past couple days.  He is hemodynamically stable.  No bleeding since admission.  Positive h/o previous diverticular bleeding and h/o prostate XRT.   DDX-most likely lower bleeding, diverticular versus internal hemorrhoids, radiation proctitis with bleeding telangiectasia.  Less likely upper source from result of DRE.  Continue serial hgb, transfuse as needed.  Will likely need to do flexible sigmoidoscopy, will plan for monday unless there is a clinical change, continue ppi as stress ulcer prophylaxis.  Following.  Electronic Signatures: Loistine Simas (MD)  (Signed 21-Nov-15 19:41)  Authored: Brief Consult Note   Last Updated: 21-Nov-15 19:41 by Loistine Simas (MD)

## 2014-06-17 NOTE — Discharge Summary (Signed)
PATIENT NAME:  Cody Gordon, Cody Gordon MR#:  [id number] DATE OF BIRTH:  1920-12-24  DATE OF ADMISSION:  01/14/2014 DATE OF DISCHARGE:  01/18/2014   PRIMARY CARE PHYSICIAN: Non local.  CONSULTANTS: Lollie Sails, MD  DISCHARGE DIAGNOSES: Gastrointestinal bleeding, melena, hematochezia, anemia acute blood loss, generalized weakness, frequent falls, and dementia with agitation.   CONDITION: Stable.   CODE STATUS: Do Not Resuscitate.   HOME MEDICATIONS: Please refer to the medication reconciliation list.   DIET: Regular, mechanical soft diet.   ACTIVITY: As tolerated.   FOLLOW-UP CARE: Follow with PCP and Dr. Gustavo Lah within 1-2 weeks.   REASON FOR ADMISSION: Melena for 3 days.   HOSPITAL COURSE: The patient is a 81 year old Caucasian male with a history of PE, DVT, and arthritis who was sent to the ED due to melena for 3 days. For detailed history and physical examination, please refer to the admission note dictated by me on the admission date.  1.  Gastrointestinal bleeding with anemia of acute blood loss. On the admission date, the patient's hemoglobin was a 10.5, but this decreased to 7.0. The patient has no acute bleeding in the hospital. The patient was treated with Protonix and got 1 unit of blood transfusion. The patient's hemoglobin has been stable after blood transfusion. Hemoglobin increased to 7.7 and today increased to 8.6. Dr. Gustavo Lah suggested the patient is not a candidate for endoscopy at this time. The patient has no active bleeding, and may follow up as an outpatient.  2.  Dehydration. The patient's dehydration improved after IV fluid support.  3.  Frequent falls and generalized weakness. According to the PT evaluation, the patient needs physical therapy and needs skilled nursing facility placement.  4.  Dementia with agitation. The patient is being treated with Remeron and haldo prn according to the psychiatry physician consult.   DISPOSITION: The patient has no  complaints.  Vital signs are stable. He is clinically stable, and will be discharged to a nursing home today.   I discussed the patient's discharge plan with the patient, the nurse and the social worker.   TIME SPENT: About 37 minutes.   ____________________________ Demetrios Loll, MD qc:MT D: 01/18/2014 07:53:08 ET T: 01/18/2014 08:23:52 ET JOB#: [id number]  cc: Demetrios Loll, MD, <Dictator> Demetrios Loll MD ELECTRONICALLY SIGNED 01/18/2014 20:42

## 2014-06-17 NOTE — Consult Note (Signed)
Psychiatry: Follow-up for this 81 year old gentleman.  Earlier in this week he had seemed to be doing much better and was lucid and cooperative.  Evidently he was doing well this morning and the plan was for discharge.  After lunch today he became acutely agitated.  Since then I'm told he has been continuously agitated trying to get up out of bed and pulling down the blinds.  He on exam today is unable to interact.  Very delirious.  We'll respond to speak.  He is verbalizing without making any sense.  Moving arms purposelessly.  We gave him 10 mg of Geodon IM earlier and he has now had some Haldol and Ativan and it has still only partially calmed him down.  I'm not clear why he has gotten so much more agitated.  Suggest that they continue workup to see if there is some reason why things of gotten worse.  Meanwhile I will ask them to give him another 10 mg of IM Geodon stat and then 20 mg twice a day as well as when necessary Haldol IM.  Follow-up over the weekend as needed.  Electronic Signatures: Clapacs, Madie Reno (MD)  (Signed on 25-Nov-15 17:40)  Authored  Last Updated: 25-Nov-15 17:40 by Gonzella Lex (MD)

## 2014-06-17 NOTE — Consult Note (Signed)
Psychiatry: PAtient seen and reviewed. PAtient was unable to give any history. Continues to be occasionally agitated and confused. Has a Actuary. Currently safe.Unclear why he had a marked decline yesterday. Current medication adequate for control. Chemistry panal unremarkable. Will order repeat CT to rule out gros acute intracranial change.  Electronic Signatures: Gonzella Lex (MD)  (Signed on 26-Nov-15 23:15)  Authored  Last Updated: 26-Nov-15 23:15 by Gonzella Lex (MD)

## 2014-06-17 NOTE — H&P (Signed)
PATIENT NAME:  Cody Gordon, Cody Gordon MR#:  [id number] DATE OF BIRTH:  1920-11-13  DATE OF ADMISSION:  01/14/2014  PRIMARY CARE PHYSICIAN:  Nonlocal.  REFERRING PHYSICIAN:  Larae Grooms, MD   CHIEF COMPLAINT:  Melena for 3 days.   HISTORY OF PRESENT ILLNESS: A 81 year old Caucasian male with a history of arthritis, PE, DVT, urinary tract infection presented to the ED with the above chief complaint. The patient is alert, awake, oriented, in no acute distress. The patient said he noticed that he has had black stool for the past 3 days.  In addition, the patient feels weak and fell 3 times.  He denies any syncope, loss of consciousness, or seizure. Denies any hematuria or easy bleeding.  According to the patient's wife, she noticed that the patient also has fresh bloody stool yesterday, but she does not know the amounts because it was flushed.   The patient's hemoglobin is 10.5 today.  He was treated with Protonix IV 1 dose in the ED. The patient denies any intake of coagulation medication, aspirin or Plavix, no intake of ibuprofen or other NSAIDs.  The patient denies any other symptoms.   PAST MEDICAL HISTORY: Lumbar spine stenosis, bilateral PE in October 2010 status post IVC filter, right nonocclusive deep vein thrombosis, UTI, diverticular bleeding in 2010, prostate cancer status post radiation and Lupron therapy.   PAST SURGICAL HISTORY: Appendectomy, tonsillectomy, back surgeries, knee surgeries, inguinal hernia repair.    SOCIAL HISTORY: No smoking or drinking or illicit drugs.   ALLERGIES: None.     MEDICATIONS:  None.   REVIEW OF SYSTEMS.  CONSTITUTIONAL: The patient denies any fever or chills. No headache, but has dizziness and generalized weakness.  EYES: No double or blurry vision.  ENT: No postnasal drip, slurred speech or dysphagia.  CARDIOVASCULAR: No chest pain, palpitation, orthopnea, nocturnal dyspnea. No leg edema.  PULMONARY: No cough, sputum, shortness of breath, or  hematemesis.   GASTROINTESTINAL:  No abdominal pain, nausea, vomiting, diarrhea. No bloody stool, but has melena.  GENITOURINARY: No dysuria, hematuria, or incontinence.  SKIN: No rash or jaundice.  NEUROLOGY: No syncope, loss of consciousness, or seizure. ENDOCRINOLOGY: No polyuria, polydipsia, heat or cold intolerance.  HEMATOLOGIC: No easy bruising or bleeding.   PHYSICAL EXAMINATION:  VITAL SIGNS: Temperature 98.1, blood pressure 135/90, oxygen saturation 100%, pulse 72.  GENERAL: The patient is alert, awake, oriented, in no acute distress.  HEENT: Pupils round, equal and reactive to light and accommodation. Moist oral mucosa. Clear oropharynx.  NECK: Supple. No JVD or carotid bruit. No lymphadenopathy. No thyromegaly,  CARDIOVASCULAR: S1 and S2. Regular rate and rhythm. No murmurs or gallops.  PULMONARY: Bilateral air entry. No wheezing or rales. No use of accessory muscle to breathe.  ABDOMEN: Soft. No distention or tenderness. No organomegaly, bowel sounds present.  EXTREMITIES: No edema, clubbing or cyanosis. No calf tenderness. Bilateral pedal pulses present.  SKIN: No rash or jaundice.  NEUROLOGY: A and O x 3. No focal deficit. Power 5/5. Sensation intact.   LABORATORY DATA: Chest x-ray, no evidence of acute cardiopulmonary disease. WBC 9.7, hemoglobin 10.5, platelets 167,000, glucose 148, BUN 25, creatinine 1.26.  Electrolytes normal. INR 1.3, troponin 0.02. EKG showed normal sinus rhythm with sinus arrhythmia at 68 BPM with a right bundle branch block.   IMPRESSIONS:  1.  Gastrointestinal bleeding.  2.  Anemia.  3.  Dehydration.  4.  Orthostatic hypotension.   PLAN OF TREATMENT:  1.  The patient will be admitted to medical floor  and we will keep n.p.o. except medication, give normal saline IV fluid support and continue Protonix 40 mg IV b.i.d. and get a GI consult for possible endoscopy. The patient's stool occult is positive in the Emergency Department.  2.  For  dehydration, we will give IV fluid support. Follow up BMP.  3.  Anemia, as mentioned above, we will continue Protonix IV b.i.d. and follow up hemoglobin every 8 hours.  I discussed the patient's condition and plan of treatment with the patient and the patient's wife.  The patient is FULL CODE.    TIME SPENT: About 52 minutes.     ____________________________ Demetrios Loll, MD qc:DT D: 01/14/2014 15:33:32 ET T: 01/14/2014 16:04:03 ET JOB#: [id number]  cc: Demetrios Loll, MD, <Dictator> Demetrios Loll MD ELECTRONICALLY SIGNED 01/14/2014 17:18

## 2014-06-17 NOTE — Consult Note (Signed)
PATIENT NAME:  Cody Gordon, Cody Gordon MR#:  [id number] DATE OF BIRTH:  1921/01/04  DATE OF CONSULTATION:  01/16/2014  CONSULTING PHYSICIAN:  Gonzella Lex, MD  IDENTIFYING INFORMATION AND REASON FOR CONSULTATION: A 81 year old man currently in the hospital because of presumed GI bleeding. Consult for delirium, dementia, and agitation.   HISTORY OF PRESENT ILLNESS: Information obtained from the patient and the chart. Chart was reviewed. It appears that at his baseline the patient is described as being functional around the house. Be able to do some yard work, ambulatory, but having some degree of memory loss. It is also reported that he has a chronic bit of a temper, but more detailed than that I do not have. Since being in the hospital, the patient has become intermittently agitated. He became aggressive and agitated at one point, required IM Geodon and IV doses of Haldol. The patient himself does not give me any particular useful history. He does know that he is in the hospital. He can tell me that he is in the hospital because he was having blackness in his stools. He does not have any memory or insight of becoming agitated. Does not have any complaints about his mood. He is not reporting any acute psychotic symptoms, but he is confused.   PAST PSYCHIATRIC HISTORY: No known past psychiatric history. No history of psychiatric hospitalization. No history of suicidal behavior. No history of psychiatric diagnosis. No history of substance abuse.   SOCIAL HISTORY: Evidently lives with his elderly wife. No living children. Has some assistance from neighbors as well, but there is concern that he may not be able to return home.   MEDICAL HISTORY: Currently in the hospital for black stools and workup of possible GI bleeding. Has a history of lumbar spine stenosis. history of pulmonary embolisms in the past, prostate cancer.   FAMILY HISTORY: No known family history.   SUBSTANCE ABUSE HISTORY: Denies any history  of alcohol or drug abuse.   CURRENT MEDICATIONS: In the hospital he is getting p.r.n. Zofran for nausea, Protonix IV.   ALLERGIES: No known drug allergies.   REVIEW OF SYSTEMS: The patient denies having any pain. Denies any hallucinations. Denies any acute mood symptoms. Does not have any other specific complaints.   MENTAL STATUS EXAMINATION: Elderly gentleman interviewed in his hospital bed. No family present. He was easily arousable and made good eye contact and attempted to engage in conversation. His speech was very difficult to understand. It sounds like he does not have any teeth and he was slurring his words so that I could not make out everything he said. Affect appeared to be euthymic, with some appropriate reactivity. Mood was stated as all right. Thoughts appeared to be a little bit scattered. He was not able to tell me the correct year or date. He was able to tell me that he was in the hospital, but not what city he was in. Did not attempt formal cognitive testing beyond that. I did not do any further memory testing. Clearly has at least mild dementia.   LABORATORY RESULTS: Hemoglobin is low and has been going down even with transfusions. Most recent one 8.4. Before that, he was down below 8, down as low as 7.  Magnesium low at 1.7, glucose slightly elevated, sodium slightly elevated. Does have blood in his stool. PT elevated.   VITAL SIGNS:  Temperature 97.8, pulse 62, respirations 18, blood pressure 120/73.   ASSESSMENT: A 81 year old man currently in the hospital with a  gastrointestinal bleed, acutely ill, having some delirium, probably typical hospital-related delirium. Goal would be to keep him comfortable and keep him from trying to get out of bed or do anything dangerous without oversedating him excessively.   TREATMENT PLAN: He was being ordered what seemed like fairly high doses of IV haloperidol for someone his age. I switched him to 1 mg IV every 4 hours p.r.n. Additionally, I  have given him 7.5 mg of oral mirtazapine that can be taken at night that may help with sleep and started 5 mg of Aricept for dementia. Will follow as needed.   Reviewed it with the patient and he stated that he was fine with the plan.   DIAGNOSIS, PRINCIPAL AND PRIMARY:  AXIS I: Delirium due to multiple hospital factors.   SECONDARY DIAGNOSES: AXIS I: Dementia, mild, probably Alzheimer's.  AXIS II: No diagnosis.  AXIS III: Gastrointestinal bleed.   ____________________________ Gonzella Lex, MD jtc:LT D: 01/16/2014 18:29:21 ET T: 01/16/2014 18:49:46 ET JOB#: [id number]  cc: Gonzella Lex, MD, <Dictator> Gonzella Lex MD ELECTRONICALLY SIGNED 01/20/2014 14:46

## 2014-06-17 NOTE — Consult Note (Signed)
Chief Complaint:  Subjective/Chief Complaint seen for hematochezia.  less combative today.  denies abdominal pain.  no repeat rectal bleeding. tolerating full liquids.   VITAL SIGNS/ANCILLARY NOTES: **Vital Signs.:   24-Nov-15 07:20  Vital Signs Type Q 8hr  Temperature Temperature (F) 97.5  Celsius 36.3  Temperature Source oral  Pulse Pulse 58  Respirations Respirations 18  Systolic BP Systolic BP 983  Diastolic BP (mmHg) Diastolic BP (mmHg) 67  Mean BP 82  Pulse Ox % Pulse Ox % 93  Pulse Ox Activity Level  At rest  Oxygen Delivery Room Air/ 21 %   Brief Assessment:  Cardiac Regular   Respiratory clear BS   Gastrointestinal details normal Soft  Nontender  Nondistended  No masses palpable  Bowel sounds normal   Assessment/Plan:  Assessment/Plan:  Assessment 1) hematochezia-likely diverticular bleeding in the setting of previous history of similar episode.  hemodynamically stable.  no repeat bleeding for over 2 days 2) dementia   Plan 1) advance diet as tolerated.  if there is recurrent bleeding recommend bleeding scan urgently.  discussed with Dr Verdell Carmine.   Electronic Signatures: Loistine Simas (MD)  (Signed [phone] 17:43)  Authored: Chief Complaint, VITAL SIGNS/ANCILLARY NOTES, Brief Assessment, Assessment/Plan   Last Updated: 24-Nov-15 17:43 by Loistine Simas (MD)

## 2014-06-17 NOTE — Consult Note (Signed)
PATIENT NAME:  Cody Gordon, Cody Gordon MR#:  [id number] DATE OF BIRTH:  02-13-1921  DATE OF CONSULTATION:  01/14/2014  CONSULTING PHYSICIAN:  Lollie Sails, MD  REASON FOR CONSULTATION: GI bleed and.   HISTORY OF PRESENT ILLNESS: Cody Gordon is a pleasant, 80 year old, Caucasian male who was in his usual state of health until about a night or 2 ago. Cody Gordon tells me that yesterday, he noted a black bowel movement and decided to come on in today to the hospital. His wife, who accompanies him, stated that she noted the day before that when he had a bowel movement there was a red streak in the toilet. He denies any abdominal pain. There has been no nausea or vomiting. There is no heartburn, no dysphagia. Appetite has been good. He has had a stable weight. He has a daily bowel movement generally. He does not have to take things for constipation. He denies seeing any other episodes of black stools, blood in the stools or slimy stools. He does not take any medications at home. He has been hemodynamically stable. He has had no repeat bleeding since he has come into the hospital.   PAST MEDICAL HISTORY: He has a history of prostate cancer and did have radiation and Lupron treatment for this. He had an episode of diverticular bleeding in 2010. He has a history of a DVT and does have a IVC filter. He has had bilateral total knee replacements. He has had bilateral cataract surgeries. He has had a lumbar fusion. He has had diverticulitis in the past. There is also history of arthritis. He has had an appendectomy, tonsillectomy as well as inguinal herniorrhaphy.   OUTPATIENT MEDICATIONS: None.   ALLERGIES: None.   SOCIAL HISTORY: He does not drink. He does not smoke.   REVIEW OF SYSTEMS: 10 systems reviewed per admission history and physical, agree with same.   PHYSICAL EXAMINATION: VITAL SIGNS: Temperature is 97.7, pulse 66, respirations 18, blood pressure 122/79, pulse oximetry 97%.   GENERAL: He is a  81 year old, Caucasian male. No acute distress.   HEAD: Normocephalic, atraumatic.   EYES: Anicteric.   NOSE: Septum midline.   OROPHARYNX: No lesions.   NECK: Supple. No JVD.   HEART: Regular rate and rhythm.   LUNGS: Clear.   ABDOMEN: Soft, nontender, nondistended. Bowel sounds are positive, normoactive.   RECTAL: Digital rectal examination shows likely internal hemorrhoid with a small irregularity just inside the anal ring, possible mild stenosis of the anal ring. It is of note that he has had radiation treatment for prostate cancer in the past. There was a watery dark bloody effluent. This was not thick. This did not appear fresh.   EXTREMITIES: No clubbing, cyanosis or edema.   NEUROLOGICAL: Cranial nerves 2 through 12 grossly intact. Muscle strength bilaterally equal and symmetric.   LABORATORIES: Include the following: On admission to the hospital, he had a glucose of 148, BUN 25, creatinine 1.26, sodium 144, potassium 4.0, chloride 109, bicarbonate 24, calcium 8.4. Hepatic profile showing a total protein of 5.8, albumin 3.3, total bilirubin 0.7, alkaline phosphatase 63, AST 21, ALT 18. He has had troponin I x 1 that was normal 0.02. His first Hemogram at about 2:00 this afternoon showed a white count of 9.7, hemoglobin and hematocrit 10.5/31.2, platelet count 167,000. His MCV was 99. He has had 1 repeat hemoglobin about 4 hours after the first being at 9.3. This is with some rehydration. His protime was 15.9, INR of 1.3.   He  had a portable chest film showing no evidence of acute cardiopulmonary disease. It is of note that he had a fall at home several times over the past couple of days.    ASSESSMENT: Hematochezia. The patient does not show any upper gastroinestinal symptoms. He does not take nonsteroidal anti-inflammatories or other medications. He has had no dyspepsia. On rectal examination, he does show a bloody effluent that is dark but not melena. This could be consistent  with either anal outlet bleeding of some source, for instance internal hemorrhoids or radiation proctitis.   RECOMMENDATIONS: 1. Serial hemoglobins.  2. Transfuse as needed.  3. Continue IV PPI as prophylaxis.  4. Will likely need at least a flexible sigmoidoscopy depending on the clinical situation. He has been hemodynamically stable. We will follow with you.     ____________________________ Lollie Sails, MD mus:TT D: 01/14/2014 19:35:55 ET T: 01/14/2014 20:34:06 ET JOB#: [id number]  cc: Lollie Sails, MD, <Dictator> Lollie Sails MD ELECTRONICALLY SIGNED 02/14/2014 1:40

## 2014-06-17 NOTE — Consult Note (Signed)
Psychiatry: Patient seen this morning.  He was awake and alert.  Speech was more understandable.  He had no new complaints.  His affect was smiling and reactive.  He was able to identify family members were visiting him.  He was able to identify correctly where he was although he still was not able to tell me the correct year.  Mood was stated as good. with dementia currently stable.  Not having a great deal of agitation or delirium.  Seems to slept better and had a better night last night.  Psychoeducation and supportive counseling done.  No change to medication.  Will follow-up if he continues to be in the hospital.  Electronic Signatures: Maridel Pixler, Madie Reno (MD)  (Signed on 24-Nov-15 18:04)  Authored  Last Updated: 24-Nov-15 18:04 by Gonzella Lex (MD)

## 2014-06-17 NOTE — Discharge Summary (Signed)
PATIENT NAME:  Cody Gordon, WEIDA MR#:  [id number] DATE OF BIRTH:  [birth year]  DATE OF ADMISSION:  01/14/2014 DATE OF DISCHARGE:    ADDENDUM:   Please refer to the discharge summary already dictated by Dr. Bridgett Larsson on 01/18/2014.   PERTINENT LABORATORIES AT DISCHARGE: Sodium 145, potassium 3.5, chloride 112, bicarbonate 27, BUN 17, creatinine 1.0, glucose 99, hemoglobin 8.6.   CONSULTATIONS:  1.  Gonzella Lex, MD 2.  Lollie Sails, MD  ADMISSION DIAGNOSIS: Gastrointestinal bleed.   DISCHARGE DIAGNOSES:  1.  Gastrointestinal bleed with anemia and acute blood loss.  2.  Dehydration.  3.  Frequent falls and generalized weakness.  4.  Dementia with agitation.  5.  Again, please refer to the already dictated discharge summary. This is a short addendum to the discharge summary from 01/18/2014.   HOSPITAL COURSE: The patient was kept in the hospital for 2 additional days, due to his agitation from his dementia. Dr. Weber Cooks was consulted during the hospitalization for further evaluation and treatment. The patient's mood seems to be stabilized, not agitated, over the past 24 hours. He will continue Remeron and donepezil, along with Haldol and Geodon p.r.n. for agitation.   As far as his anemia, it seems to be stable; no evidence of GI blood loss.   DISCHARGE MEDICATIONS:  1.  Tylenol 325 mg 2 tablets q. 4 hours p.r.n. pain.  2.  Remeron 7.5 at bedtime.  3.  Donepezil 5 mg at bedtime.  4.  Pantoprazole 40 mg daily.  5.  Haldol 1 mg every 8 hours p.r.n. agitation.  6.  Geodon 20 mg IM q. 12 hours p.r.n. agitation.   DISCHARGE DIET: Regular diet, mechanical soft.   DISCHARGE ACTIVITY: As tolerated.   DISCHARGE FOLLOWUP: In 2 weeks with Dr. Gustavo Lah.   DISPOSITION: The patient was stable for discharge home.   TIME SPENT: Approximately 35 minutes.    ____________________________ Donell Beers. Benjie Karvonen, MD spm:MT D: 01/21/2014 11:49:02 ET T: 01/21/2014 12:04:14  ET JOB#: [id number]  cc: Adhrit Krenz P. Benjie Karvonen, MD, <Dictator> Donell Beers Alissia Lory MD ELECTRONICALLY SIGNED 01/21/2014 13:13

## 2014-06-17 NOTE — Consult Note (Signed)
Chief Complaint:  Subjective/Chief Complaint seen for hematochezia.  no recurrent since yesterday afternoon. Patietn currently sedated, minimally responsive to questions. Patietn given one unit prbc this am.   VITAL SIGNS/ANCILLARY NOTES: **Vital Signs.:   23-Nov-15 16:34  Vital Signs Type Q 8hr  Temperature Temperature (F) 97.8  Celsius 36.5  Temperature Source axillary  Pulse Pulse 62  Respirations Respirations 18  Systolic BP Systolic BP 166  Diastolic BP (mmHg) Diastolic BP (mmHg) 73  Mean BP 88  Pulse Ox % Pulse Ox % 95  Pulse Ox Activity Level  At rest  Oxygen Delivery Room Air/ 21 %   Brief Assessment:  Cardiac Regular   Respiratory clear BS   Gastrointestinal details normal Soft  Nontender  Nondistended  Bowel sounds normal   Lab Results: Routine BB:  23-Nov-15 06:27   ABO Group + Rh Type A Positive  Antibody Screen NEGATIVE (Result(s) reported on 16 Jan 2014 at 08:09AM.)  Crossmatch Unit 1 Ready  Crossmatch Unit 2 Issued (Result(s) reported on 16 Jan 2014 at 09:57AM.)  Routine Hem:  23-Nov-15 05:08   Hemoglobin (CBC)  7.0 (Result(s) reported on 16 Jan 2014 at 05:48AM.)    13:55   Hemoglobin (CBC)  8.4 (Result(s) reported on 16 Jan 2014 at 02:26PM.)   Assessment/Plan:  Assessment/Plan:  Assessment 1) hematochezia-likely recurrent diverticular bleeding in the setting of previous h/o same.  no bleeding over 24 hours.  One unit prbc given this am.   2) patietn with ams/agitiation.  question of possible dementia.  currently sedated.   Plan 1) continue daily cbc.  if there is recurrent bleeding, would do bleeding scan and vascular consult.  Following,.   Electronic Signatures: Loistine Simas (MD)  (Signed [phone] 18:12)  Authored: Chief Complaint, VITAL SIGNS/ANCILLARY NOTES, Brief Assessment, Lab Results, Assessment/Plan   Last Updated: 23-Nov-15 18:12 by Loistine Simas (MD)

## 2015-02-23 ENCOUNTER — Emergency Department
Admission: EM | Admit: 2015-02-23 | Discharge: 2015-02-23 | Disposition: A | Discharge status: Home / Self Care | Payer: Medicare Other | Attending: Emergency Medicine | Admitting: Emergency Medicine

## 2015-02-23 ENCOUNTER — Encounter: Payer: Self-pay | Admitting: Emergency Medicine

## 2015-02-23 ENCOUNTER — Emergency Department: Payer: Medicare Other

## 2015-02-23 DIAGNOSIS — F0390 Unspecified dementia without behavioral disturbance: Secondary | ICD-10-CM | POA: Insufficient documentation

## 2015-02-23 DIAGNOSIS — Y92128 Other place in nursing home as the place of occurrence of the external cause: Secondary | ICD-10-CM | POA: Diagnosis not present

## 2015-02-23 DIAGNOSIS — S6991XA Unspecified injury of right wrist, hand and finger(s), initial encounter: Secondary | ICD-10-CM | POA: Diagnosis present

## 2015-02-23 DIAGNOSIS — W1839XA Other fall on same level, initial encounter: Secondary | ICD-10-CM | POA: Insufficient documentation

## 2015-02-23 DIAGNOSIS — Z79899 Other long term (current) drug therapy: Secondary | ICD-10-CM | POA: Diagnosis not present

## 2015-02-23 DIAGNOSIS — M25531 Pain in right wrist: Secondary | ICD-10-CM

## 2015-02-23 DIAGNOSIS — Y998 Other external cause status: Secondary | ICD-10-CM | POA: Insufficient documentation

## 2015-02-23 DIAGNOSIS — Y9389 Activity, other specified: Secondary | ICD-10-CM | POA: Diagnosis not present

## 2015-02-23 HISTORY — DX: Anemia, unspecified: D64.9

## 2015-02-23 HISTORY — DX: Unspecified dementia, unspecified severity, without behavioral disturbance, psychotic disturbance, mood disturbance, and anxiety: F03.90

## 2015-02-23 NOTE — ED Notes (Signed)
Patient from [facility] via [place] with c/o unwitnessed fall. Patient fell "sometime during the night" and self mobilized to a chair. Patient's wife notified staff this morning of the fall and subsequent right wrist pain. Patient however, has no complaints at this time. Baseline dementia

## 2015-02-23 NOTE — Discharge Instructions (Signed)
Musculoskeletal Pain Musculoskeletal pain is muscle and boney aches and pains. These pains can occur in any part of the body. Your caregiver may treat you without knowing the cause of the pain. They may treat you if blood or urine tests, X-rays, and other tests were normal.  CAUSES There is often not a definite cause or reason for these pains. These pains may be caused by a type of germ (virus). The discomfort may also come from overuse. Overuse includes working out too hard when your body is not fit. Boney aches also come from weather changes. Bone is sensitive to atmospheric pressure changes. HOME CARE INSTRUCTIONS   Ask when your test results will be ready. Make sure you get your test results.  Only take over-the-counter or prescription medicines for pain, discomfort, or fever as directed by your caregiver. If you were given medications for your condition, do not drive, operate machinery or power tools, or sign legal documents for 24 hours. Do not drink alcohol. Do not take sleeping pills or other medications that may interfere with treatment.  Continue all activities unless the activities cause more pain. When the pain lessens, slowly resume normal activities. Gradually increase the intensity and duration of the activities or exercise.  During periods of severe pain, bed rest may be helpful. Lay or sit in any position that is comfortable.  Putting ice on the injured area.  Put ice in a bag.  Place a towel between your skin and the bag.  Leave the ice on for 15 to 20 minutes, 3 to 4 times a day.  Follow up with your caregiver for continued problems and no reason can be found for the pain. If the pain becomes worse or does not go away, it may be necessary to repeat tests or do additional testing. Your caregiver may need to look further for a possible cause. SEEK IMMEDIATE MEDICAL CARE IF:  You have pain that is getting worse and is not relieved by medications.  You develop chest pain  that is associated with shortness or breath, sweating, feeling sick to your stomach (nauseous), or throw up (vomit).  Your pain becomes localized to the abdomen.  You develop any new symptoms that seem different or that concern you. MAKE SURE YOU:   Understand these instructions.  Will watch your condition.  Will get help right away if you are not doing well or get worse.   This information is not intended to replace advice given to you by your health care provider. Make sure you discuss any questions you have with your health care provider.   Document Released: 02/10/2005 Document Revised: 05/05/2011 Document Reviewed: 10/15/2012 Elsevier Interactive Patient Education 2016 [employer]  Wrist Pain There are many things that can cause wrist pain. Some common causes include:  An injury to the wrist area, such as a sprain, strain, or fracture.  Overuse of the joint.  A condition that causes increased pressure on a nerve in the wrist (carpal tunnel syndrome).  Wear and tear of the joints that occurs with aging (osteoarthritis).  A variety of other types of arthritis. Sometimes, the cause of wrist pain is not known. The pain often goes away when you follow your health care provider's instructions for relieving pain at home. If your wrist pain continues, tests may need to be done to diagnose your condition. HOME CARE INSTRUCTIONS Pay attention to any changes in your symptoms. Take these actions to help with your pain:  Rest the wrist area for  at least 48 hours or as told by your health care provider.  If directed, apply ice to the injured area:  Put ice in a plastic bag.  Place a towel between your skin and the bag.  Leave the ice on for 20 minutes, 2-3 times per day.  Keep your arm raised (elevated) above the level of your heart while you are sitting or lying down.  If a splint or elastic bandage has been applied, use it as told by your health care provider.  Remove the  splint or bandage only as told by your health care provider.  Loosen the splint or bandage if your fingers become numb or have a tingling feeling, or if they turn cold or blue.  Take over-the-counter and prescription medicines only as told by your health care provider.  Keep all follow-up visits as told by your health care provider. This is important. SEEK MEDICAL CARE IF:  Your pain is not helped by treatment.  Your pain gets worse. SEEK IMMEDIATE MEDICAL CARE IF:  Your fingers become swollen.  Your fingers turn white, very red, or cold and blue.  Your fingers are numb or have a tingling feeling.  You have difficulty moving your fingers.   This information is not intended to replace advice given to you by your health care provider. Make sure you discuss any questions you have with your health care provider.   Document Released: 11/20/2004 Document Revised: 11/01/2014 Document Reviewed: 06/28/2014 Elsevier Interactive Patient Education [employer].

## 2015-02-23 NOTE — ED Provider Notes (Signed)
[facility] Emergency Department Provider Note  ____________________________________________  Time seen: 11:00 AM  I have reviewed the triage vital signs and the nursing notes.   HISTORY  Chief Complaint Fall    HPI Cody Gordon. is a 81 y.o. male sent to the ED from [place] because of an unwitnessed fall. He fell sometime during the night and was able to get up and get to a chair. This morning he is complaining of wrist pain and is sent to the ED for evaluation. At this time he denies any complaints. Denies any wrist pain or other issues. No headache or neck pain. He does have dementia and is reported to be at his baseline mental status.     Past Medical History  Diagnosis Date  . Neoplasm, bladder   . Prostate cancer ([place])   . Tachycardia     bradycardia  . Nephrolithiasis   . Incomplete emptying of bladder   . Hematuria   . Dementia   . Anemia      Patient Active Problem List   Diagnosis Date Noted  . CARDIAC ARRHYTHMIA 11/14/2009  . DIZZINESS 11/14/2009  . SHORTNESS OF BREATH 11/14/2009     Past Surgical History  Procedure Laterality Date  . Back surgery       Current Outpatient Rx  Name  Route  Sig  Dispense  Refill  . acetaminophen (TYLENOL) 325 MG tablet   Oral   Take 650 mg by mouth every 4 (four) hours as needed.         . bisacodyl (BISACODYL) 5 MG EC tablet   Oral   Take 10 mg by mouth daily as needed for moderate constipation.         Marland Kitchen donepezil (ARICEPT) 10 MG tablet   Oral   Take 10 mg by mouth at bedtime.         . ergocalciferol (VITAMIN D2) 50000 units capsule   Oral   Take 50,000 Units by mouth once a week.         . haloperidol (HALDOL) 1 MG tablet   Oral   Take 1 mg by mouth every 8 (eight) hours as needed for agitation.         . mirtazapine (REMERON) 15 MG tablet   Oral   Take 15 mg by mouth at bedtime.         Marland Kitchen omeprazole (PRILOSEC) 20 MG capsule   Oral   Take 20 mg  by mouth daily.         . potassium chloride (K-DUR,KLOR-CON) 10 MEQ tablet   Oral   Take 10 mEq by mouth daily.         . QUEtiapine (SEROQUEL) 50 MG tablet   Oral   Take 50 mg by mouth 2 (two) times daily.            Allergies Review of patient's allergies indicates no known allergies.   History reviewed. No pertinent family history.  Social History Social History  Substance Use Topics  . Smoking status: Unknown If Ever Smoked  . Smokeless tobacco: None     Comment: tobacco use -no  . Alcohol Use: No    Review of Systems  Constitutional:   No fever or chills. No weight changes Eyes:   No blurry vision or double vision.  ENT:   No sore throat. Cardiovascular:   No chest pain. Respiratory:   No dyspnea or cough. Gastrointestinal:   Negative for abdominal  pain, vomiting and diarrhea.  No BRBPR or melena. Genitourinary:   Negative for dysuria, urinary retention, bloody urine, or difficulty urinating. Musculoskeletal:   Negative for back pain. No joint swelling or pain. Skin:   Negative for rash. Neurological:   Negative for headaches, focal weakness or numbness. Psychiatric:  No anxiety or depression.   Endocrine:  No hot/cold intolerance, changes in energy, or sleep difficulty.  10-point ROS otherwise negative.  ____________________________________________   PHYSICAL EXAM:  VITAL SIGNS: ED Triage Vitals  Enc Vitals Group     BP 02/23/15 1100 123/70 mmHg     Pulse Rate 02/23/15 1055 53     Resp 02/23/15 1055 17     Temp 02/23/15 1055 98 F (36.7 C)     Temp Source 02/23/15 1055 Oral     SpO2 02/23/15 1055 96 %     Weight 02/23/15 1055 171 lb (77.565 kg)     Height 02/23/15 1055 5\' 11"  (1.803 m)     Head Cir --      Peak Flow --      Pain Score --      Pain Loc --      Pain Edu? --      Excl. in [place]? --     Vital signs reviewed, nursing assessments reviewed.   Constitutional:   Alert and oriented to self. Well appearing and in no distress.  Well-groomed Eyes:   No scleral icterus. No conjunctival pallor. PERRL. EOMI ENT   Head:   Normocephalic and atraumatic.   Nose:   No congestion/rhinnorhea. No septal hematoma   Mouth/Throat:   MMM, no pharyngeal erythema. No peritonsillar mass. No uvula shift.   Neck:   No stridor. No SubQ emphysema. No meningismus. Full range of motion, no midline tenderness  Hematological/Lymphatic/Immunilogical:   No cervical lymphadenopathy. Cardiovascular:   RRR. Normal and symmetric distal pulses are present in all extremities. No murmurs, rubs, or gallops. Respiratory:   Normal respiratory effort without tachypnea nor retractions. Breath sounds are clear and equal bilaterally. No wheezes/rales/rhonchi. Gastrointestinal:   Soft and nontender. No distention. There is no CVA tenderness.  No rebound, rigidity, or guarding. Genitourinary:   deferred Musculoskeletal:   Nontender with normal range of motion in all extremities. No joint effusions.  No lower extremity tenderness.  No edema. Small focal area over the right distal radius of soft tissue swelling Neurologic:   Normal speech and language. Somewhat confabulatory CN 2-10 normal. Motor grossly intact. No gross focal neurologic deficits are appreciated.  Skin:    Skin is warm, dry and intact. No rash noted.  No petechiae, purpura, or bullae. Psychiatric:   Mood and affect are normal. Speech and behavior are normal. Patient exhibits appropriate insight and judgment.  ____________________________________________    LABS (pertinent positives/negatives) (all labs ordered are listed, but only abnormal results are displayed) Labs Reviewed - No data to display ____________________________________________   EKG  Interpreted by me Sinus bradycardia rate of 50, axis and intervals. Right bundle branch block. Normal ST segments and T waves.  ____________________________________________    RADIOLOGY  X-ray right forearm  unremarkable  ____________________________________________   PROCEDURES   ____________________________________________   INITIAL IMPRESSION / ASSESSMENT AND PLAN / ED COURSE  Pertinent labs & imaging results that were available during my care of the patient were reviewed by me and considered in my medical decision making (see chart for details).  Patient presents after an unwitnessed fall in the middle the night but was able to  get back up and ambulate afterward. Only complaint at the time his right wrist pain. At this time he has no complaints focal had an x-ray of the wrist since there is a small amount of swelling in the area. He is otherwise appears to be stable and low suspicion for head injury or neck injury. Low suspicion for any underlying medical illness that caused the fall such as stroke ACS PE TAD AAA or sepsis.  ----------------------------------------- 12:47 PM on 02/23/2015 -----------------------------------------  X-ray negative, vital stable, will send home.     ____________________________________________   FINAL CLINICAL IMPRESSION(S) / ED DIAGNOSES  Final diagnoses:  Wrist pain, acute, right      Carrie Mew, MD 02/23/15 1247

## 2015-03-10 ENCOUNTER — Emergency Department
Admission: EM | Admit: 2015-03-10 | Discharge: 2015-03-10 | Disposition: A | Discharge status: Home / Self Care | Payer: Medicare Other | Attending: Emergency Medicine | Admitting: Emergency Medicine

## 2015-03-10 ENCOUNTER — Encounter: Payer: Self-pay | Admitting: Emergency Medicine

## 2015-03-10 ENCOUNTER — Emergency Department: Payer: Medicare Other

## 2015-03-10 DIAGNOSIS — W1839XA Other fall on same level, initial encounter: Secondary | ICD-10-CM | POA: Insufficient documentation

## 2015-03-10 DIAGNOSIS — S41111A Laceration without foreign body of right upper arm, initial encounter: Secondary | ICD-10-CM | POA: Diagnosis not present

## 2015-03-10 DIAGNOSIS — S4991XA Unspecified injury of right shoulder and upper arm, initial encounter: Secondary | ICD-10-CM | POA: Diagnosis present

## 2015-03-10 DIAGNOSIS — Z79899 Other long term (current) drug therapy: Secondary | ICD-10-CM | POA: Insufficient documentation

## 2015-03-10 DIAGNOSIS — Y998 Other external cause status: Secondary | ICD-10-CM | POA: Insufficient documentation

## 2015-03-10 DIAGNOSIS — Y9389 Activity, other specified: Secondary | ICD-10-CM | POA: Insufficient documentation

## 2015-03-10 DIAGNOSIS — N390 Urinary tract infection, site not specified: Secondary | ICD-10-CM | POA: Diagnosis not present

## 2015-03-10 DIAGNOSIS — F0390 Unspecified dementia without behavioral disturbance: Secondary | ICD-10-CM | POA: Diagnosis not present

## 2015-03-10 DIAGNOSIS — Y92129 Unspecified place in nursing home as the place of occurrence of the external cause: Secondary | ICD-10-CM | POA: Insufficient documentation

## 2015-03-10 LAB — CBC WITH DIFFERENTIAL/PLATELET
Basophils Absolute: 0 K/uL (ref 0–0.1)
Basophils Relative: 1 %
Eosinophils Absolute: 0.4 K/uL (ref 0–0.7)
Eosinophils Relative: 7 %
HCT: 36.9 % — ABNORMAL LOW (ref 40.0–52.0)
Hemoglobin: 11.7 g/dL — ABNORMAL LOW (ref 13.0–18.0)
Lymphocytes Relative: 19 %
Lymphs Abs: 1.1 K/uL (ref 1.0–3.6)
MCH: 25.6 pg — ABNORMAL LOW (ref 26.0–34.0)
MCHC: 31.7 g/dL — ABNORMAL LOW (ref 32.0–36.0)
MCV: 80.6 fL (ref 80.0–100.0)
Monocytes Absolute: 0.8 K/uL (ref 0.2–1.0)
Monocytes Relative: 13 %
Neutro Abs: 3.5 K/uL (ref 1.4–6.5)
Neutrophils Relative %: 60 %
Platelets: 185 K/uL (ref 150–440)
RBC: 4.57 MIL/uL (ref 4.40–5.90)
RDW: 18.3 % — ABNORMAL HIGH (ref 11.5–14.5)
WBC: 5.8 K/uL (ref 3.8–10.6)

## 2015-03-10 LAB — BASIC METABOLIC PANEL
Anion gap: 9 (ref 5–15)
BUN: 15 mg/dL (ref 6–20)
CHLORIDE: 107 mmol/L (ref 101–111)
CO2: 24 mmol/L (ref 22–32)
CREATININE: 1.16 mg/dL (ref 0.61–1.24)
Calcium: 9.5 mg/dL (ref 8.9–10.3)
GFR calc Af Amer: >60 mL/min (ref >60)
GFR, EST NON AFRICAN AMERICAN: 53 mL/min — AB (ref >60)
GLUCOSE: 103 mg/dL — AB (ref 65–99)
POTASSIUM: 3.6 mmol/L (ref 3.5–5.1)
SODIUM: 140 mmol/L (ref 135–145)

## 2015-03-10 LAB — URINALYSIS COMPLETE WITH MICROSCOPIC (ARMC ONLY)
BILIRUBIN URINE: NEGATIVE
GLUCOSE, UA: NEGATIVE mg/dL
KETONES UR: NEGATIVE mg/dL
NITRITE: NEGATIVE
Protein, ur: NEGATIVE mg/dL
SPECIFIC GRAVITY, URINE: 1.016 (ref 1.005–1.030)
pH: 5 (ref 5.0–8.0)

## 2015-03-10 LAB — TROPONIN I
TROPONIN I: 0.04 ng/mL — AB (ref <0.031)
Troponin I: 0.03 ng/mL

## 2015-03-10 MED ORDER — CEPHALEXIN 500 MG PO CAPS: 500.0000 mg | ORAL_CAPSULE | Freq: Two times a day (BID) | ORAL | Status: DC

## 2015-03-10 MED ORDER — SODIUM CHLORIDE 0.9 % IV BOLUS (SEPSIS)
500.0000 mL | Freq: Once | INTRAVENOUS | Status: AC
  Administered 2015-03-10: 500 mL via INTRAVENOUS

## 2015-03-10 MED ORDER — DEXTROSE 5 % IV SOLN
1.0000 g | Freq: Once | INTRAVENOUS | Status: AC
  Administered 2015-03-10: 1 g via INTRAVENOUS
  Filled 2015-03-10: qty 10

## 2015-03-10 NOTE — ED Notes (Signed)
Report given to [facility] at [facility]

## 2015-03-10 NOTE — ED Provider Notes (Signed)
[facility] Emergency Department Provider Note   ____________________________________________  Time seen: Approximately 8 AM I have reviewed the triage vital signs and the triage nursing note.  HISTORY  Chief Complaint Fall   Historian Patient, poor historian due to dementia Nursing home staff by phone and written report Health care power of attorney, nephew, by phone, [name]   HPI Cody Gordon. is a 82 y.o. male who lives at [place] with his wife and is being sent in by staff for unwitnessed fall, evaluation for traumatic injury, as well as upper respiratory congestion and generalized weakness for a couple of days. No report of fever. Wife had stated that the patient was leaning over to put his socks on and he fell over. The patient himself is unsure whether or not he hit his head. Nursing home staff said he was found on the floor near the wall and they are not sure if he hit his head or not. Wife has had upper respiratory congestion as well.     Past Medical History  Diagnosis Date  . Neoplasm, bladder   . Prostate cancer ([place])   . Tachycardia     bradycardia  . Nephrolithiasis   . Incomplete emptying of bladder   . Hematuria   . Dementia   . Anemia     Patient Active Problem List   Diagnosis Date Noted  . CARDIAC ARRHYTHMIA 11/14/2009  . DIZZINESS 11/14/2009  . SHORTNESS OF BREATH 11/14/2009    Past Surgical History  Procedure Laterality Date  . Back surgery      Current Outpatient Rx  Name  Route  Sig  Dispense  Refill  . acetaminophen (TYLENOL) 325 MG tablet   Oral   Take 650 mg by mouth every 4 (four) hours as needed.         . bisacodyl (BISACODYL) 5 MG EC tablet   Oral   Take 10 mg by mouth daily as needed for moderate constipation.         . cephALEXin (KEFLEX) 500 MG capsule   Oral   Take 1 capsule (500 mg total) by mouth 2 (two) times daily.   14 capsule   0   . donepezil (ARICEPT) 10 MG tablet    Oral   Take 10 mg by mouth at bedtime.         . ergocalciferol (VITAMIN D2) 50000 units capsule   Oral   Take 50,000 Units by mouth once a week.         . haloperidol (HALDOL) 1 MG tablet   Oral   Take 1 mg by mouth every 8 (eight) hours as needed for agitation.         . mirtazapine (REMERON) 15 MG tablet   Oral   Take 15 mg by mouth at bedtime.         Marland Kitchen omeprazole (PRILOSEC) 20 MG capsule   Oral   Take 20 mg by mouth daily.         . potassium chloride (K-DUR,KLOR-CON) 10 MEQ tablet   Oral   Take 10 mEq by mouth daily.         . QUEtiapine (SEROQUEL) 50 MG tablet   Oral   Take 50 mg by mouth 2 (two) times daily.           Allergies Review of patient's allergies indicates no known allergies.  History reviewed. No pertinent family history.  Social History Social History  Substance Use Topics  .  Smoking status: Unknown If Ever Smoked  . Smokeless tobacco: None     Comment: tobacco use -no  . Alcohol Use: No    Review of Systems  Constitutional: Negative for fever. Eyes: Negative for visual changes. ENT: Negative for sore throat. Positive for runny nose. Cardiovascular: Negative for chest pain. Respiratory: Negative for shortness of breath. Gastrointestinal: Negative for abdominal pain, vomiting and diarrhea. Genitourinary: Negative for dysuria. Musculoskeletal: Negative for back pain. Skin: Negative for rash. Neurological: Negative for headache. 10 point Review of Systems otherwise negative ____________________________________________   PHYSICAL EXAM:  VITAL SIGNS: ED Triage Vitals  Enc Vitals Group     BP 03/10/15 0744 128/59 mmHg     Pulse Rate 03/10/15 0744 53     Resp 03/10/15 0744 18     Temp 03/10/15 0744 98.1 F (36.7 C)     Temp Source 03/10/15 0744 Oral     SpO2 03/10/15 0744 97 %     Weight 03/10/15 0744 200 lb (90.719 kg)     Height 03/10/15 0744 5\' 11"  (1.803 m)     Head Cir --      Peak Flow --      Pain Score  03/10/15 0745 0     Pain Loc --      Pain Edu? --      Excl. in [place]? --      Constitutional: Alert and cooperative, but disoriented. Well appearing and overall and in no distress. Eyes: Conjunctivae are normal. PERRL. Normal extraocular movements. ENT   Head: Normocephalic and atraumatic.   Nose: Positive for mild nasal congestion.   Mouth/Throat: Mucous membranes are mildly dry.   Neck: No stridor. Cardiovascular/Chest: Normal rate, regular rhythm.  No murmurs, rubs, or gallops. Respiratory: Normal respiratory effort without tachypnea nor retractions. Breath sounds are clear and equal bilaterally. No wheezes/rales/rhonchi. Gastrointestinal: Soft. No distention, no guarding, no rebound. Nontender.   Genitourinary/rectal:Deferred Musculoskeletal: Pelvis stable and hips nontender. Right forearm tiny skin tear without any bony abnormality of the right arm. No joint effusions.  No lower extremity tenderness.  No edema. Neurologic: No slurred speech. No gross or focal neurologic deficits are appreciated. Skin:  Skin is warm, dry. Skin tear as above.   ____________________________________________   EKG I, Lisa Roca, MD, the attending physician have personally viewed and interpreted all ECGs.  45 bpm. Sinus bradycardia. Incomplete right bundle branch block. Nonspecific T-wave _________________________________________  LABS (pertinent positives/negaturinalysis 3+ leukocytes, too numerous to count red blood cells and white blood cells and rare bacteria White blood count 5.8, hemoglobin 1.7 and platelet count [id number]  Basic metabolic panel without significant abnormalities Troponin 0.04 Repeat troponin 0.03  ____________________________________________  RADIOLOGY All Xrays were viewed by me. Imaging interpreted by Radiologist. CT head noncontrast and CT cervical spine non-contrast:   IMPRESSION: No acute intracranial findings.  Mild age related atrophy and chronic  ischemic microvascular disease.  No acute cervical spine injury.  Moderate spondylosis of the cervical spine with mild multilevel disc disease and multilevel bilateral neural foraminal narrowing. __________________________________________  PROCEDURES  Procedure(s) performed: None  Critical Care performed: None  ____________________________________________   ED COURSE / ASSESSMENT AND PLAN  CONSULTATIONS: None  Pertinent labs & imaging results that were available during my care of the patient were reviewed by me and considered in my medical decision making (see chart for details).  Although this patient is a poor histohistory obtained from he as well as the nursing home staff. It is unclear to me whether or  not he has hit his head, and given his dementia I am going to go ahead and image head and neck given that they found him on the floor next to the wall is quite likely that he could have hit his head or neck.  No traumatic injuries found on head and neck CT.  I had discussed evaluation laboratory and imaging with [name], his family Media planner.  UTI found on urinalysis and treated initially with Rocephin here in the emergency Department. Patient will be discharged home with a prescription for Keflex.  EKG is without specific abnormality. Troponin was 0.04. A 3 hour level was rechecked at 11 AM and was 0.03.  Patiently for discharge back to nursing home.    ___________________________________________   FINAL CLINICAL IMPRESSION(S) / ED DIAGNOSES   Final diagnoses:  UTI (lower urinary tract infection)  Skin tear of right upper arm without complication, initial encounter              Note: This dictation was prepared with Dragon dictation. Any transcriptional errors that result from this process are unintentional   Lisa Roca, MD 03/10/15 1219

## 2015-03-10 NOTE — ED Notes (Signed)
Wife states pt tried to put his socks on and fell over.  Skin tear to right wrist.

## 2015-03-10 NOTE — ED Notes (Signed)
Reports trying to put on socks and slipped and fell to carpet. Pt denies pain.  MAE.  Quarter sized superficial skin tear noted to right wrist.  Bleeding controlled.  Reports recent nasal congestion.  Lungs clear.  Denies cough or fever. Skin w/d.

## 2015-03-10 NOTE — Discharge Instructions (Signed)
You were evaluated after fall, and no serious injury was found. A urinary tract infection was found on laboratory evaluation and antibiotic Rocephin injection was given emergency Department to cover for 24 hours. Prescription sent for antibiotic Keflex for one week.  Return to the emergency room for any worsening condition including any new confusion altered mental status, trouble breathing, inability to urinate, abdominal pain, vomiting, or any other symptoms concerning to you.   Urinary Tract Infection A urinary tract infection (UTI) can occur any place along the urinary tract. The tract includes the kidneys, ureters, bladder, and urethra. A type of germ called bacteria often causes a UTI. UTIs are often helped with antibiotic medicine.  HOME CARE   If given, take antibiotics as told by your doctor. Finish them even if you start to feel better.  Drink enough fluids to keep your pee (urine) clear or pale yellow.  Avoid tea, drinks with caffeine, and bubbly (carbonated) drinks.  Pee often. Avoid holding your pee in for a long time.  Pee before and after having sex (intercourse).  Wipe from front to back after you poop (bowel movement) if you are a woman. Use each tissue only once. GET HELP RIGHT AWAY IF:   You have back pain.  You have lower belly (abdominal) pain.  You have chills.  You feel sick to your stomach (nauseous).  You throw up (vomit).  Your burning or discomfort with peeing does not go away.  You have a fever.  Your symptoms are not better in 3 days. MAKE SURE YOU:   Understand these instructions.  Will watch your condition.  Will get help right away if you are not doing well or get worse.   This information is not intended to replace advice given to you by your health care provider. Make sure you discuss any questions you have with your health care provider.   Document Released: 07/30/2007 Document Revised: 03/03/2014 Document Reviewed:  09/11/2011 Elsevier Interactive Patient Education 2016 [employer] Skin Tear Care A skin tear is a wound in which the top layer of skin has peeled off. This is a common problem with aging because the skin becomes thinner and more fragile as a person gets older. In addition, some medicines, such as oral corticosteroids, can lead to skin thinning if taken for long periods of time.  A skin tear is often repaired with tape or skin adhesive strips. This keeps the skin that has been peeled off in contact with the healthier skin beneath. Depending on the location of the wound, a bandage (dressing) may be applied over the tape or skin adhesive strips. Sometimes, during the healing process, the skin turns black and dies. Even when this happens, the torn skin acts as a good dressing until the skin underneath gets healthier and repairs itself. HOME CARE INSTRUCTIONS   Change dressings once per day or as directed by your caregiver.  Gently clean the skin tear and the area around the tear using saline solution or mild soap and water.  Do not rub the injured skin dry. Let the area air dry.  Apply petroleum jelly or an antibiotic cream or ointment to keep the tear moist. This will help the wound heal. Do not allow a scab to form.  If the dressing sticks before the next dressing change, moisten it with warm soapy water and gently remove it.  Protect the injured skin until it has healed.  Only take over-the-counter or prescription medicines as directed by your caregiver.  Take showers or baths using warm soapy water. Apply a new dressing after the shower or bath.  Keep all follow-up appointments as directed by your caregiver.  SEEK IMMEDIATE MEDICAL CARE IF:   You have redness, swelling, or increasing pain in the skin tear.  You havepus coming from the skin tear.  You have chills.  You have a red streak that goes away from the skin tear.  You have a bad smell coming from the tear or  dressing.  You have a fever or persistent symptoms for more than 2-3 days.  You have a fever and your symptoms suddenly get worse. MAKE SURE YOU:  Understand these instructions.  Will watch this condition.  Will get help right away if your child is not doing well or gets worse.   This information is not intended to replace advice given to you by your health care provider. Make sure you discuss any questions you have with your health care provider.   Document Released: 11/05/2000 Document Revised: 11/05/2011 Document Reviewed: 08/25/2011 Elsevier Interactive Patient Education [employer].

## 2015-03-12 LAB — URINE CULTURE: Culture: 40000

## 2015-03-19 ENCOUNTER — Emergency Department
Admission: EM | Admit: 2015-03-19 | Discharge: 2015-03-19 | Disposition: A | Discharge status: Home / Self Care | Payer: Medicare Other | Attending: Emergency Medicine | Admitting: Emergency Medicine

## 2015-03-19 ENCOUNTER — Emergency Department: Payer: Medicare Other

## 2015-03-19 ENCOUNTER — Encounter: Payer: Self-pay | Admitting: Emergency Medicine

## 2015-03-19 DIAGNOSIS — W01198A Fall on same level from slipping, tripping and stumbling with subsequent striking against other object, initial encounter: Secondary | ICD-10-CM | POA: Diagnosis not present

## 2015-03-19 DIAGNOSIS — Y9389 Activity, other specified: Secondary | ICD-10-CM | POA: Diagnosis not present

## 2015-03-19 DIAGNOSIS — Z792 Long term (current) use of antibiotics: Secondary | ICD-10-CM | POA: Diagnosis not present

## 2015-03-19 DIAGNOSIS — Y998 Other external cause status: Secondary | ICD-10-CM | POA: Insufficient documentation

## 2015-03-19 DIAGNOSIS — S0101XA Laceration without foreign body of scalp, initial encounter: Secondary | ICD-10-CM | POA: Diagnosis not present

## 2015-03-19 DIAGNOSIS — Z79899 Other long term (current) drug therapy: Secondary | ICD-10-CM | POA: Diagnosis not present

## 2015-03-19 DIAGNOSIS — Y92128 Other place in nursing home as the place of occurrence of the external cause: Secondary | ICD-10-CM | POA: Insufficient documentation

## 2015-03-19 DIAGNOSIS — S0990XA Unspecified injury of head, initial encounter: Secondary | ICD-10-CM

## 2015-03-19 NOTE — Discharge Instructions (Signed)
Head Injury, Adult You have a head injury. Headaches and throwing up (vomiting) are common after a head injury. It should be easy to wake up from sleeping. Sometimes you must stay in the hospital. Most problems happen within the first 24 hours. Side effects may occur up to 7-10 days after the injury.  WHAT ARE THE TYPES OF HEAD INJURIES? Head injuries can be as minor as a bump. Some head injuries can be more severe. More severe head injuries include:  A jarring injury to the brain (concussion).  A bruise of the brain (contusion). This mean there is bleeding in the brain that can cause swelling.  A cracked skull (skull fracture).  Bleeding in the brain that collects, clots, and forms a bump (hematoma). WHEN SHOULD I GET HELP RIGHT AWAY?   You are confused or sleepy.  You cannot be woken up.  You feel sick to your stomach (nauseous) or keep throwing up (vomiting).  Your dizziness or unsteadiness is getting worse.  You have very bad, lasting headaches that are not helped by medicine. Take medicines only as told by your doctor.  You cannot use your arms or legs like normal.  You cannot walk.  You notice changes in the black spots in the center of the colored part of your eye (pupil).  You have clear or bloody fluid coming from your nose or ears.  You have trouble seeing. During the next 24 hours after the injury, you must stay with someone who can watch you. This person should get help right away (call 911 in the [place] if you start to shake and are not able to control it (have seizures), you pass out, or you are unable to wake up. HOW CAN I PREVENT A HEAD INJURY IN THE FUTURE?  Wear seat belts.  Wear a helmet while bike riding and playing sports like football.  Stay away from dangerous activities around the house. WHEN CAN I RETURN TO NORMAL ACTIVITIES AND ATHLETICS? See your doctor before doing these activities. You should not do normal activities or play contact sports until 1  week after the following symptoms have stopped:  Headache that does not go away.  Dizziness.  Poor attention.  Confusion.  Memory problems.  Sickness to your stomach or throwing up.  Tiredness.  Fussiness.  Bothered by bright lights or loud noises.  Anxiousness or depression.  Restless sleep. MAKE SURE YOU:   Understand these instructions.  Will watch your condition.  Will get help right away if you are not doing well or get worse.   This information is not intended to replace advice given to you by your health care provider. Make sure you discuss any questions you have with your health care provider.   Document Released: 01/24/2008 Document Revised: 03/03/2014 Document Reviewed: 10/18/2012 Elsevier Interactive Patient Education 2016 [place], Adult A laceration is a cut that goes through all of the layers of the skin and into the tissue that is right under the skin. Some lacerations heal on their own. Others need to be closed with stitches (sutures), staples, skin adhesive strips, or skin glue. Proper laceration care minimizes the risk of infection and helps the laceration to heal better. HOW TO CARE FOR YOUR LACERATION If sutures or staples were used:  Keep the wound clean and dry.  If you were given a bandage (dressing), you should change it at least one time per day or as told by your health care provider. You should also  change it if it becomes wet or dirty.  Keep the wound completely dry for the first 24 hours or as told by your health care provider. After that time, you may shower or bathe. However, make sure that the wound is not soaked in water until after the sutures or staples have been removed.  Clean the wound one time each day or as told by your health care provider:  Wash the wound with soap and water.  Rinse the wound with water to remove all soap.  Pat the wound dry with a clean towel. Do not rub the wound.  After cleaning  the wound, apply a thin layer of antibiotic ointmentas told by your health care provider. This will help to prevent infection and keep the dressing from sticking to the wound.  Have the sutures or staples removed as told by your health care provider. If skin adhesive strips were used:  Keep the wound clean and dry.  If you were given a bandage (dressing), you should change it at least one time per day or as told by your health care provider. You should also change it if it becomes dirty or wet.  Do not get the skin adhesive strips wet. You may shower or bathe, but be careful to keep the wound dry.  If the wound gets wet, pat it dry with a clean towel. Do not rub the wound.  Skin adhesive strips fall off on their own. You may trim the strips as the wound heals. Do not remove skin adhesive strips that are still stuck to the wound. They will fall off in time. If skin glue was used:  Try to keep the wound dry, but you may briefly wet it in the shower or bath. Do not soak the wound in water, such as by swimming.  After you have showered or bathed, gently pat the wound dry with a clean towel. Do not rub the wound.  Do not do any activities that will make you sweat heavily until the skin glue has fallen off on its own.  Do not apply liquid, cream, or ointment medicine to the wound while the skin glue is in place. Using those may loosen the film before the wound has healed.  If you were given a bandage (dressing), you should change it at least one time per day or as told by your health care provider. You should also change it if it becomes dirty or wet.  If a dressing is placed over the wound, be careful not to apply tape directly over the skin glue. Doing that may cause the glue to be pulled off before the wound has healed.  Do not pick at the glue. The skin glue usually remains in place for 5-10 days, then it falls off of the skin. General Instructions  Take over-the-counter and  prescription medicines only as told by your health care provider.  If you were prescribed an antibiotic medicine or ointment, take or apply it as told by your doctor. Do not stop using it even if your condition improves.  To help prevent scarring, make sure to cover your wound with sunscreen whenever you are outside after stitches are removed, after adhesive strips are removed, or when glue remains in place and the wound is healed. Make sure to wear a sunscreen of at least 30 SPF.  Do not scratch or pick at the wound.  Keep all follow-up visits as told by your health care provider. This is important.  Check your wound every day for signs of infection. Watch for:  Redness, swelling, or pain.  Fluid, blood, or pus.  Raise (elevate) the injured area above the level of your heart while you are sitting or lying down, if possible. SEEK MEDICAL CARE IF:  You received a tetanus shot and you have swelling, severe pain, redness, or bleeding at the injection site.  You have a fever.  A wound that was closed breaks open.  You notice a bad smell coming from your wound or your dressing.  You notice something coming out of the wound, such as wood or glass.  Your pain is not controlled with medicine.  You have increased redness, swelling, or pain at the site of your wound.  You have fluid, blood, or pus coming from your wound.  You notice a change in the color of your skin near your wound.  You need to change the dressing frequently due to fluid, blood, or pus draining from the wound.  You develop a new rash.  You develop numbness around the wound. SEEK IMMEDIATE MEDICAL CARE IF:  You develop severe swelling around the wound.  Your pain suddenly increases and is severe.  You develop painful lumps near the wound or on skin that is anywhere on your body.  You have a red streak going away from your wound.  The wound is on your hand or foot and you cannot properly move a finger or  toe.  The wound is on your hand or foot and you notice that your fingers or toes look pale or bluish.   This information is not intended to replace advice given to you by your health care provider. Make sure you discuss any questions you have with your health care provider.   Document Released: 02/10/2005 Document Revised: 06/27/2014 Document Reviewed: 02/06/2014 Elsevier Interactive Patient Education [employer].

## 2015-03-19 NOTE — ED Notes (Signed)
Pt discharged to [facility] after report called and facility verbalizing understanding of discharge instructions; nad noted.

## 2015-03-19 NOTE — ED Provider Notes (Signed)
[facility] Emergency Department Provider Note  ____________________________________________  Time seen: 12:40 PM  I have reviewed the triage vital signs and the nursing notes.   HISTORY  Chief Complaint Fall Level 5 caveat:  Portions of the history and physical were unable to be obtained due to the patient's acute illness    HPI Cody Gordon. is a 82 y.o. male is brought to the ED from [place] after an unwitnessed fall. He was found near a chair that he had been sitting on and according to those present at the scene had apparently slipped out of the chair and hit his head on the way down. Patient denies any pain or other complaints. No vomiting and is at his neurologic baseline. He has a history of dementia.     Past Medical History  Diagnosis Date  . Neoplasm, bladder   . Prostate cancer ([place])   . Tachycardia     bradycardia  . Nephrolithiasis   . Incomplete emptying of bladder   . Hematuria   . Dementia   . Anemia      Patient Active Problem List   Diagnosis Date Noted  . CARDIAC ARRHYTHMIA 11/14/2009  . DIZZINESS 11/14/2009  . SHORTNESS OF BREATH 11/14/2009     Past Surgical History  Procedure Laterality Date  . Back surgery       Current Outpatient Rx  Name  Route  Sig  Dispense  Refill  . acetaminophen (TYLENOL) 325 MG tablet   Oral   Take 650 mg by mouth every 4 (four) hours as needed.         . bisacodyl (BISACODYL) 5 MG EC tablet   Oral   Take 10 mg by mouth daily as needed for moderate constipation.         . cephALEXin (KEFLEX) 500 MG capsule   Oral   Take 1 capsule (500 mg total) by mouth 2 (two) times daily.   14 capsule   0   . donepezil (ARICEPT) 10 MG tablet   Oral   Take 10 mg by mouth at bedtime.         . ergocalciferol (VITAMIN D2) 50000 units capsule   Oral   Take 50,000 Units by mouth once a week.         . haloperidol (HALDOL) 1 MG tablet   Oral   Take 1 mg by mouth every 8  (eight) hours as needed for agitation.         . mirtazapine (REMERON) 15 MG tablet   Oral   Take 15 mg by mouth at bedtime.         Marland Kitchen omeprazole (PRILOSEC) 20 MG capsule   Oral   Take 20 mg by mouth daily.         . potassium chloride (K-DUR,KLOR-CON) 10 MEQ tablet   Oral   Take 10 mEq by mouth daily.         . QUEtiapine (SEROQUEL) 50 MG tablet   Oral   Take 50 mg by mouth 2 (two) times daily.            Allergies Review of patient's allergies indicates no known allergies.   No family history on file.  Social History Social History  Substance Use Topics  . Smoking status: Unknown If Ever Smoked  . Smokeless tobacco: None     Comment: tobacco use -no  . Alcohol Use: No    Review of Systems  Constitutional:  No fever or chills. No weight changes Eyes:   No blurry vision or double vision.  ENT:   No sore throat. Cardiovascular:   No chest pain. Respiratory:   No dyspnea or cough. Gastrointestinal:   Negative for abdominal pain, vomiting and diarrhea.  No BRBPR or melena. Genitourinary:   Negative for dysuria, urinary retention, bloody urine, or difficulty urinating. Musculoskeletal:   Negative for back pain. No joint swelling or pain. Skin:   Negative for rash. Neurological:   Negative for headaches, focal weakness or numbness. Psychiatric:  No anxiety or depression.   Endocrine:  No hot/cold intolerance, changes in energy, or sleep difficulty.  10-point ROS otherwise negative.  ____________________________________________   PHYSICAL EXAM:  VITAL SIGNS: ED Triage Vitals  Enc Vitals Group     BP 03/19/15 1237 139/76 mmHg     Pulse Rate 03/19/15 1237 55     Resp 03/19/15 1237 10     Temp 03/19/15 1237 98.3 F (36.8 C)     Temp Source 03/19/15 1237 Oral     SpO2 03/19/15 1237 92 %     Weight 03/19/15 1237 194 lb 0.1 oz (88 kg)     Height 03/19/15 1237 6' (1.829 m)     Head Cir --      Peak Flow --      Pain Score --      Pain Loc --       Pain Edu? --      Excl. in [place]? --     Vital signs reviewed, nursing assessments reviewed.   Constitutional:   Alert and oriented to person. Well appearing and in no distress. Eyes:   No scleral icterus. No conjunctival pallor. PERRL. EOMI ENT   Head:   Normocephalic with a 1.5 cm linear laceration on the posterior parietal scalp. Hemostatic. Wound is clean. No significant bony tenderness or deformity..   Nose:   No congestion/rhinnorhea. No septal hematoma   Mouth/Throat:   MMM, no pharyngeal erythema. No peritonsillar mass. No uvula shift.   Neck:   No stridor. No SubQ emphysema. No meningismus. Hematological/Lymphatic/Immunilogical:   No cervical lymphadenopathy. Cardiovascular:   RRR. Normal and symmetric distal pulses are present in all extremities. No murmurs, rubs, or gallops. Respiratory:   Normal respiratory effort without tachypnea nor retractions. Breath sounds are clear and equal bilaterally. No wheezes/rales/rhonchi. Gastrointestinal:   Soft and nontender. No distention. There is no CVA tenderness.  No rebound, rigidity, or guarding. Genitourinary:   deferred Musculoskeletal:   Nontender with normal range of motion in all extremities. No joint effusions.  No lower extremity tenderness.  No edema. Neurologic:   Normal speech and language.  CN 2-10 normal. Motor grossly intact. No gross focal neurologic deficits are appreciated.  Skin:    Skin is warm, dry and intact. No rash noted.  No petechiae, purpura, or bullae. Psychiatric:   Mood and affect are normal. Speech and behavior are normal. Patient exhibits appropriate insight and judgment.  ____________________________________________    LABS (pertinent positives/negatives) (all labs ordered are listed, but only abnormal results are displayed) Labs Reviewed - No data to display ____________________________________________   EKG    ____________________________________________    RADIOLOGY  CT  head unremarkable  ____________________________________________   PROCEDURES LACERATION REPAIR Performed by: Joni Fears, Lillan Mccreadie Authorized by: Carrie Mew Consent: Verbal consent obtained. Risks and benefits: risks, benefits and alternatives were discussed Consent given by: patient Patient identity confirmed: provided demographic data Prepped and Draped in normal sterile fashion Wound  explored  Laceration Location: Left parietal scalp  Laceration Length: 1.5cm  No Foreign Bodies seen or palpated  Anesthesia: None Irrigation method: syringe Amount of cleaning: standard  Skin closure: Staples   Number of sutures: 1  Technique: Hand-held stapler application   Patient tolerance: Patient tolerated the procedure well with no immediate complications.   ____________________________________________   INITIAL IMPRESSION / ASSESSMENT AND PLAN / ED COURSE  Pertinent labs & imaging results that were available during my care of the patient were reviewed by me and considered in my medical decision making (see chart for details).  Patient presents with head injury after a fall. He said his apparent baseline. No evidence of any other injuries including spinal injury or musculoskeletal injury. CT head negative. Wound cleaned, repaired with one staple. Follow-up with primary care closely for monitoring of symptoms. Follow-up with primary care in 7-10 days for removal of staple if nursing home staff are not comfortable removing the staples themselves..     ____________________________________________   FINAL CLINICAL IMPRESSION(S) / ED DIAGNOSES  Final diagnoses:  Scalp laceration, initial encounter  Head injury due to trauma, initial encounter      Carrie Mew, MD 03/19/15 1337

## 2015-03-19 NOTE — ED Notes (Signed)
Pt had unwitnessed fall at [facility] house; has hematoma/laceration to back of head. Pt does not know whether or not he lost consciousness.

## 2015-06-22 ENCOUNTER — Encounter: Payer: Self-pay | Admitting: Emergency Medicine

## 2015-06-22 ENCOUNTER — Observation Stay
Admission: EM | Admit: 2015-06-22 | Discharge: 2015-06-26 | Disposition: A | Discharge status: Home / Self Care | Payer: Medicare Other | Attending: Internal Medicine | Admitting: Internal Medicine

## 2015-06-22 DIAGNOSIS — Z8249 Family history of ischemic heart disease and other diseases of the circulatory system: Secondary | ICD-10-CM | POA: Diagnosis not present

## 2015-06-22 DIAGNOSIS — I340 Nonrheumatic mitral (valve) insufficiency: Secondary | ICD-10-CM | POA: Diagnosis not present

## 2015-06-22 DIAGNOSIS — R0602 Shortness of breath: Secondary | ICD-10-CM | POA: Diagnosis not present

## 2015-06-22 DIAGNOSIS — R001 Bradycardia, unspecified: Principal | ICD-10-CM | POA: Diagnosis present

## 2015-06-22 DIAGNOSIS — W19XXXA Unspecified fall, initial encounter: Secondary | ICD-10-CM | POA: Diagnosis not present

## 2015-06-22 DIAGNOSIS — D649 Anemia, unspecified: Secondary | ICD-10-CM | POA: Insufficient documentation

## 2015-06-22 DIAGNOSIS — Z86718 Personal history of other venous thrombosis and embolism: Secondary | ICD-10-CM | POA: Diagnosis not present

## 2015-06-22 DIAGNOSIS — S60811A Abrasion of right wrist, initial encounter: Secondary | ICD-10-CM | POA: Insufficient documentation

## 2015-06-22 DIAGNOSIS — Z86711 Personal history of pulmonary embolism: Secondary | ICD-10-CM | POA: Diagnosis not present

## 2015-06-22 DIAGNOSIS — I739 Peripheral vascular disease, unspecified: Secondary | ICD-10-CM | POA: Diagnosis not present

## 2015-06-22 DIAGNOSIS — Z87442 Personal history of urinary calculi: Secondary | ICD-10-CM | POA: Insufficient documentation

## 2015-06-22 DIAGNOSIS — F0390 Unspecified dementia without behavioral disturbance: Secondary | ICD-10-CM | POA: Diagnosis not present

## 2015-06-22 DIAGNOSIS — R55 Syncope and collapse: Secondary | ICD-10-CM | POA: Diagnosis not present

## 2015-06-22 DIAGNOSIS — L8990 Pressure ulcer of unspecified site, unspecified stage: Secondary | ICD-10-CM | POA: Insufficient documentation

## 2015-06-22 DIAGNOSIS — Z7982 Long term (current) use of aspirin: Secondary | ICD-10-CM | POA: Diagnosis not present

## 2015-06-22 DIAGNOSIS — I440 Atrioventricular block, first degree: Secondary | ICD-10-CM | POA: Diagnosis not present

## 2015-06-22 DIAGNOSIS — Z79899 Other long term (current) drug therapy: Secondary | ICD-10-CM | POA: Insufficient documentation

## 2015-06-22 DIAGNOSIS — Z8546 Personal history of malignant neoplasm of prostate: Secondary | ICD-10-CM | POA: Insufficient documentation

## 2015-06-22 DIAGNOSIS — R42 Dizziness and giddiness: Secondary | ICD-10-CM | POA: Diagnosis not present

## 2015-06-22 DIAGNOSIS — I495 Sick sinus syndrome: Secondary | ICD-10-CM | POA: Insufficient documentation

## 2015-06-22 DIAGNOSIS — I4510 Unspecified right bundle-branch block: Secondary | ICD-10-CM | POA: Insufficient documentation

## 2015-06-22 DIAGNOSIS — I10 Essential (primary) hypertension: Secondary | ICD-10-CM | POA: Diagnosis not present

## 2015-06-22 DIAGNOSIS — R339 Retention of urine, unspecified: Secondary | ICD-10-CM | POA: Insufficient documentation

## 2015-06-22 DIAGNOSIS — R2681 Unsteadiness on feet: Secondary | ICD-10-CM | POA: Insufficient documentation

## 2015-06-22 HISTORY — DX: Acute embolism and thrombosis of unspecified deep veins of unspecified lower extremity: I82.409

## 2015-06-22 HISTORY — DX: Bradycardia, unspecified: R00.1

## 2015-06-22 HISTORY — DX: Presence of other vascular implants and grafts: Z95.828

## 2015-06-22 HISTORY — DX: Diverticulosis of intestine, part unspecified, without perforation or abscess with bleeding: K57.91

## 2015-06-22 NOTE — ED Notes (Signed)
Pt to rm 11 via EMS from [facility] after unwitnessed fall. Per EMS, staff found pt on floor, appeared to be on the way to bathroom.  Pt normally ambulatory w/ walker, was only wearing socks at time of fall.  EMS report skin tear to pt's right wrist, already tx with neosporin and covered with gauze.  PT NAD upon arrival, denies pain at this time.

## 2015-06-23 ENCOUNTER — Encounter: Payer: Self-pay | Admitting: Internal Medicine

## 2015-06-23 ENCOUNTER — Observation Stay (HOSPITAL_BASED_OUTPATIENT_CLINIC_OR_DEPARTMENT_OTHER)
Admit: 2015-06-23 | Discharge: 2015-06-23 | Disposition: A | Discharge status: Home / Self Care | Payer: Medicare Other | Attending: Internal Medicine | Admitting: Internal Medicine

## 2015-06-23 ENCOUNTER — Emergency Department: Payer: Medicare Other

## 2015-06-23 DIAGNOSIS — W19XXXA Unspecified fall, initial encounter: Secondary | ICD-10-CM

## 2015-06-23 DIAGNOSIS — R55 Syncope and collapse: Secondary | ICD-10-CM

## 2015-06-23 DIAGNOSIS — R001 Bradycardia, unspecified: Secondary | ICD-10-CM | POA: Diagnosis not present

## 2015-06-23 DIAGNOSIS — L8990 Pressure ulcer of unspecified site, unspecified stage: Secondary | ICD-10-CM | POA: Insufficient documentation

## 2015-06-23 LAB — COMPREHENSIVE METABOLIC PANEL
ALT: 12 U/L — ABNORMAL LOW (ref 17–63)
ANION GAP: 8 (ref 5–15)
AST: 23 U/L (ref 15–41)
Albumin: 3.7 g/dL (ref 3.5–5.0)
Alkaline Phosphatase: 81 U/L (ref 38–126)
BILIRUBIN TOTAL: 0.5 mg/dL (ref 0.3–1.2)
BUN: 14 mg/dL (ref 6–20)
CHLORIDE: 109 mmol/L (ref 101–111)
CO2: 26 mmol/L (ref 22–32)
Calcium: 9.3 mg/dL (ref 8.9–10.3)
Creatinine, Ser: 1.05 mg/dL (ref 0.61–1.24)
GFR, EST NON AFRICAN AMERICAN: 59 mL/min — AB (ref >60)
Glucose, Bld: 109 mg/dL — ABNORMAL HIGH (ref 65–99)
POTASSIUM: 3.7 mmol/L (ref 3.5–5.1)
Sodium: 143 mmol/L (ref 135–145)
TOTAL PROTEIN: 6.1 g/dL — AB (ref 6.5–8.1)

## 2015-06-23 LAB — PROTIME-INR
INR: 1.27
PROTHROMBIN TIME: 16 s — AB (ref 11.4–15.0)

## 2015-06-23 LAB — CBC WITH DIFFERENTIAL/PLATELET
BASOS ABS: 0 10*3/uL (ref 0–0.1)
BASOS PCT: 0 %
EOS ABS: 0.3 10*3/uL (ref 0–0.7)
EOS PCT: 4 %
HCT: 35.1 % — ABNORMAL LOW (ref 40.0–52.0)
Hemoglobin: 11.7 g/dL — ABNORMAL LOW (ref 13.0–18.0)
Lymphocytes Relative: 17 %
Lymphs Abs: 1 10*3/uL (ref 1.0–3.6)
MCH: 26.8 pg (ref 26.0–34.0)
MCHC: 33.3 g/dL (ref 32.0–36.0)
MCV: 80.4 fL (ref 80.0–100.0)
MONO ABS: 0.5 10*3/uL (ref 0.2–1.0)
Monocytes Relative: 8 %
Neutro Abs: 4.2 10*3/uL (ref 1.4–6.5)
Neutrophils Relative %: 71 %
PLATELETS: 181 10*3/uL (ref 150–440)
RBC: 4.36 MIL/uL — AB (ref 4.40–5.90)
RDW: 17.7 % — AB (ref 11.5–14.5)
WBC: 6 10*3/uL (ref 3.8–10.6)

## 2015-06-23 LAB — TROPONIN I
TROPONIN I: 0.03 ng/mL (ref <0.031)
Troponin I: 0.03 ng/mL (ref <0.031)
Troponin I: 0.03 ng/mL (ref <0.031)
Troponin I: 0.03 ng/mL (ref <0.031)

## 2015-06-23 LAB — ECHOCARDIOGRAM COMPLETE
Height: 71 in
WEIGHTICAEL: 2492.8 [oz_av]

## 2015-06-23 LAB — MAGNESIUM: MAGNESIUM: 1.4 mg/dL — AB (ref 1.7–2.4)

## 2015-06-23 LAB — APTT: APTT: 29 s (ref 24–36)

## 2015-06-23 MED ORDER — ENOXAPARIN SODIUM 40 MG/0.4ML ~~LOC~~ SOLN
40.0000 mg | Freq: Every day | SUBCUTANEOUS | Status: DC
  Administered 2015-06-23 – 2015-06-26 (×4): 40 mg via SUBCUTANEOUS
  Filled 2015-06-23 (×4): qty 0.4

## 2015-06-23 MED ORDER — SODIUM CHLORIDE 0.9% FLUSH
3.0000 mL | Freq: Two times a day (BID) | INTRAVENOUS | Status: DC
  Administered 2015-06-23 – 2015-06-26 (×5): 3 mL via INTRAVENOUS

## 2015-06-23 MED ORDER — MIRTAZAPINE 15 MG PO TABS
15.0000 mg | ORAL_TABLET | Freq: Every day | ORAL | Status: DC
  Administered 2015-06-24: 15 mg via ORAL
  Filled 2015-06-23: qty 1

## 2015-06-23 MED ORDER — BISACODYL 5 MG PO TBEC: 10.0000 mg | DELAYED_RELEASE_TABLET | Freq: Every day | ORAL | Status: DC | PRN

## 2015-06-23 MED ORDER — PANTOPRAZOLE SODIUM 40 MG PO TBEC
40.0000 mg | DELAYED_RELEASE_TABLET | Freq: Every day | ORAL | Status: DC
  Administered 2015-06-23 – 2015-06-26 (×4): 40 mg via ORAL
  Filled 2015-06-23 (×4): qty 1

## 2015-06-23 MED ORDER — QUETIAPINE FUMARATE 25 MG PO TABS
50.0000 mg | ORAL_TABLET | Freq: Two times a day (BID) | ORAL | Status: DC
  Administered 2015-06-23 – 2015-06-26 (×6): 50 mg via ORAL
  Filled 2015-06-23 (×6): qty 2

## 2015-06-23 MED ORDER — ONDANSETRON HCL 4 MG/2ML IJ SOLN: 4.0000 mg | Freq: Four times a day (QID) | INTRAMUSCULAR | Status: DC | PRN

## 2015-06-23 MED ORDER — SODIUM CHLORIDE 0.9 % IV SOLN
INTRAVENOUS | Status: DC
  Administered 2015-06-23: 05:00:00 via INTRAVENOUS

## 2015-06-23 MED ORDER — ACETAMINOPHEN 325 MG PO TABS: 650.0000 mg | ORAL_TABLET | ORAL | Status: DC | PRN

## 2015-06-23 MED ORDER — TRIPLE ANTIBIOTIC 3.5-400-5000 EX OINT
1.0000 "application " | TOPICAL_OINTMENT | Freq: Every day | CUTANEOUS | Status: DC
  Administered 2015-06-23 – 2015-06-25 (×3): 1 via TOPICAL
  Filled 2015-06-23 (×4): qty 1

## 2015-06-23 MED ORDER — ONDANSETRON HCL 4 MG PO TABS: 4.0000 mg | ORAL_TABLET | Freq: Four times a day (QID) | ORAL | Status: DC | PRN

## 2015-06-23 MED ORDER — ATROPINE SULFATE 1 MG/10ML IJ SOSY
PREFILLED_SYRINGE | INTRAMUSCULAR | Status: AC
  Filled 2015-06-23: qty 10

## 2015-06-23 MED ORDER — ASPIRIN 81 MG PO CHEW
81.0000 mg | CHEWABLE_TABLET | Freq: Every day | ORAL | Status: DC
  Administered 2015-06-23 – 2015-06-26 (×4): 81 mg via ORAL
  Filled 2015-06-23 (×4): qty 1

## 2015-06-23 MED ORDER — VITAMIN D (ERGOCALCIFEROL) 1.25 MG (50000 UNIT) PO CAPS
50000.0000 [IU] | ORAL_CAPSULE | ORAL | Status: DC
  Administered 2015-06-26: 50000 [IU] via ORAL
  Filled 2015-06-23: qty 1

## 2015-06-23 MED ORDER — ZIPRASIDONE MESYLATE 20 MG IM SOLR
20.0000 mg | Freq: Once | INTRAMUSCULAR | Status: DC
  Filled 2015-06-23: qty 20

## 2015-06-23 MED ORDER — DONEPEZIL HCL 5 MG PO TABS: 5.0000 mg | ORAL_TABLET | Freq: Every day | ORAL | Status: DC

## 2015-06-23 NOTE — ED Provider Notes (Addendum)
[facility] Emergency Department Provider Note  ____________________________________________   I have reviewed the triage vital signs and the nursing notes.   HISTORY  Chief Complaint Fall    HPI Cody Claro. is a 82 y.o. male who presents today complaining of a fall. History is per patient and EMS. Patient states he was going to the bathroom and his. He is not quite sure why he fell though. Normally he walks with a walker. According to EMS he is at his baseline. He has a little bit of a skin tear to the wrist but otherwise has absolutely no complaints. He has no hip pain he has no headache he has no shortness of breath he denies any numbness or weakness he feels "pretty good".      Past Medical History  Diagnosis Date  . Neoplasm, bladder   . Prostate cancer ([place])   . Tachycardia     bradycardia  . Nephrolithiasis   . Incomplete emptying of bladder   . Hematuria   . Dementia   . Anemia     Patient Active Problem List   Diagnosis Date Noted  . CARDIAC ARRHYTHMIA 11/14/2009  . DIZZINESS 11/14/2009  . SHORTNESS OF BREATH 11/14/2009    Past Surgical History  Procedure Laterality Date  . Back surgery      Current Outpatient Rx  Name  Route  Sig  Dispense  Refill  . acetaminophen (TYLENOL) 325 MG tablet   Oral   Take 650 mg by mouth every 4 (four) hours as needed.         . bisacodyl (BISACODYL) 5 MG EC tablet   Oral   Take 10 mg by mouth daily as needed for moderate constipation.         Marland Kitchen donepezil (ARICEPT) 5 MG tablet   Oral   Take 5 mg by mouth at bedtime.         . ergocalciferol (VITAMIN D2) 50000 units capsule   Oral   Take 50,000 Units by mouth once a week.         . mirtazapine (REMERON) 15 MG tablet   Oral   Take 15 mg by mouth at bedtime.         Marland Kitchen omeprazole (PRILOSEC) 20 MG capsule   Oral   Take 20 mg by mouth daily.         . potassium chloride (K-DUR,KLOR-CON) 10 MEQ tablet   Oral   Take 10  mEq by mouth daily.         . QUEtiapine (SEROQUEL) 50 MG tablet   Oral   Take 50 mg by mouth 2 (two) times daily.         . cephALEXin (KEFLEX) 500 MG capsule   Oral   Take 1 capsule (500 mg total) by mouth 2 (two) times daily.   14 capsule   0   . donepezil (ARICEPT) 10 MG tablet   Oral   Take 10 mg by mouth at bedtime.         . haloperidol (HALDOL) 1 MG tablet   Oral   Take 1 mg by mouth every 8 (eight) hours as needed for agitation.           Allergies Review of patient's allergies indicates no known allergies.  History reviewed. No pertinent family history.  Social History Social History  Substance Use Topics  . Smoking status: Unknown If Ever Smoked  . Smokeless tobacco: None  Comment: tobacco use -no  . Alcohol Use: No    Review of Systems, Limited by patient dementia but this is what he tells me Constitutional: No fever/chills Eyes: No visual changes. ENT: No sore throat. No stiff neck no neck pain Cardiovascular: Denies chest pain. Respiratory: Denies shortness of breath. Gastrointestinal:   no vomiting.  No diarrhea.  No constipation. Genitourinary: Negative for dysuria. Musculoskeletal: Negative lower extremity swelling Skin: Negative for rash. Neurological: Negative for headaches, focal weakness or numbness. 10-point ROS otherwise negative.  ____________________________________________   PHYSICAL EXAM:  VITAL SIGNS: ED Triage Vitals  Enc Vitals Group     BP 06/22/15 2348 134/83 mmHg     Pulse Rate 06/22/15 2348 65     Resp 06/22/15 2348 16     Temp 06/22/15 2348 98.2 F (36.8 C)     Temp Source 06/22/15 2348 Oral     SpO2 06/22/15 2348 96 %     Weight 06/22/15 2348 169 lb 1.6 oz (76.703 kg)     Height 06/22/15 2348 5\' 11"  (1.803 m)     Head Cir --      Peak Flow --      Pain Score 06/22/15 2350 0     Pain Loc --      Pain Edu? --      Excl. in [place]? --     Constitutional: Alert and orientedTo name and place unsure of the  year pleasantly demented in no acute distress. Well appearing and in no acute distress. Eyes: Conjunctivae are normal. PERRL. EOMI. Head: Atraumatic. Nose: No congestion/rhinnorhea. Mouth/Throat: Mucous membranes are moist.  Oropharynx non-erythematous. Neck: No stridor.   Nontender with no meningismus Cardiovascular: Normal rate, regular rhythm. Grossly normal heart sounds.  Good peripheral circulation. Respiratory: Normal respiratory effort.  No retractions. Lungs CTAB. Abdominal: Soft and nontender. No distention. No guarding no rebound Back:  There is no focal tenderness or step off there is no midline tenderness there are no lesions noted. there is no CVA tenderness Musculoskeletal: No lower extremity tenderness, no upper extremity tenderness no hip pain. Full painless range of motion of both hips No joint effusions, no DVT signs strong distal pulses no edema Neurologic:  Normal speech and language. No gross focal neurologic deficits are appreciated.  Skin:  Skin is warm, dry and intact. A superficial skin tear noted to the right arm with treatment are in place with no active bleeding or ligamentous injury no foreign body noted Psychiatric: Mood and affect are normal. Speech and behavior are normal.  ____________________________________________   LABS (all labs ordered are listed, but only abnormal results are displayed)  Labs Reviewed - No data to display ____________________________________________  EKG  I personally interpreted any EKGs ordered by me or triage Known right bundle-branch block rate 60 bpm no acute ST elevation or depression, no significant change from prior ____________________________________________  RADIOLOGY  I reviewed any imaging ordered by me or triage that were performed during my shift and, if possible, patient and/or family made aware of any abnormal findings. ____________________________________________   PROCEDURES  Procedure(s) performed:  None  Critical Care performed: None  ____________________________________________   INITIAL IMPRESSION / ASSESSMENT AND PLAN / ED COURSE  Pertinent labs & imaging results that were available during my care of the patient were reviewed by me and considered in my medical decision making (see chart for details).  Patient with what he describes as a non-syncopal fall that given his dementia is unclear. At his baseline according to  EMS. Sugar was good for them on the way in. He is awake and alert no acute distress with no significant injury noted. Unfortunately, because it was a non-witnessed fall given his age we will do a CT scan to make sure he did not suffer any significant head injury on patient is DNR/DNI and has no other indication at this time for excessive workup for what he describes as a fall related to his socks as he is walking without a walker.  ----------------------------------------- 2:41 AM on 06/23/2015 -----------------------------------------  Patient was monitored here in the emergency room and although he has a reported history of cardia and is DNR/DNI, we did notice his heart rate was sometimes going down to the high 30s. This certainly could be causing him to fall and he has a history of frequent falls. His blood pressure has been scalding steady with it so we will not acutely intervene. Because of this finding, we have checked more extensive blood work which is thus far reassuring, and I talked to the hospitalist about admitting him for further evaluation. FINAL CLINICAL IMPRESSION(S) / ED DIAGNOSES  Final diagnoses:  None      This chart was dictated using voice recognition software.  Despite best efforts to proofread,  errors can occur which can change meaning.     Schuyler Amor, MD 06/23/15 [id number]  Schuyler Amor, MD 06/23/15 [phone]

## 2015-06-23 NOTE — Progress Notes (Signed)
Patient ID: Cody Gordon., male   DOB: [birth year], 82 y.o.   MRN: [id number] 82 year old male patient with history of dementia, prostate cancer, nephrolithiasis, anemia presented to the emergency room with fall. Evaluation in the emergency room showed bradycardia. Admitting diagnosis 1. Accidental fall 2. Syncope 3. Bradycardia 4. Dementia  Appreciated Dr Olin Pia input. Will d/c aricept  PT to see Out pt holter monitoring

## 2015-06-23 NOTE — ED Provider Notes (Deleted)
[place] screening examination/treatment/procedure(s) were performed by non-physician practitioner and as supervising physician I was immediately available for consultation/collaboration.    Schuyler Amor, MD 06/23/15 [phone]

## 2015-06-23 NOTE — Care Management Obs Status (Signed)
[facility] NOTIFICATION   Patient Details  Name: Cody Gordon. MRN: [id number] Date of Birth: [birth year]   Medicare Observation Status Notification Given:  Yes    Delrae Sawyers, RN 06/23/2015, 8:48 PM

## 2015-06-23 NOTE — H&P (Signed)
[facility] [place] NAME: Cody Gordon    MR#:  [id number]  DATE OF BIRTH:  1920-12-10  DATE OF ADMISSION:  06/22/2015  PRIMARY CARE PHYSICIAN: No primary care provider on file.   REQUESTING/REFERRING PHYSICIAN:   CHIEF COMPLAINT:   Chief Complaint  Patient presents with  . Fall    HISTORY OF PRESENT ILLNESS: Cody Gordon  is a 82 y.o. male with a known history of prostate cancer, nephrolithiasis, dementia, anemia is a resident of the nursing home facility. He lost balance and fell down and does not exactly remember the sequence of events. He was walking in the hallway and lost balance and fell. Does not remember exactly whether he passed out. Patient was evaluated in the emergency room with a CT scan of the head which showed no acute endocrine abnormality. Has a small abrasion over the right wrist area with a skin tear. In the emergency room patient was found to have bradycardia with heart rate around 40 bpm. No prior history of any fall or syncope. No complaints of chest pain. No complaints of any shortness of breath. No history of orthopnea or paroxysmal nocturnal dyspnea. A set of troponin is negative.  PAST MEDICAL HISTORY:   Past Medical History  Diagnosis Date  . Neoplasm, bladder   . Prostate cancer ([place])   . Tachycardia     bradycardia  . Nephrolithiasis   . Incomplete emptying of bladder   . Hematuria   . Dementia   . Anemia     PAST SURGICAL HISTORY: Past Surgical History  Procedure Laterality Date  . Back surgery      SOCIAL HISTORY:  Social History  Substance Use Topics  . Smoking status: Never Smoker   . Smokeless tobacco: Not on file     Comment: tobacco use -no  . Alcohol Use: No    FAMILY HISTORY:  Family History  Problem Relation Age of Onset  . Hypertension Father     deceased    DRUG ALLERGIES: No Known Allergies  REVIEW OF SYSTEMS:   CONSTITUTIONAL: No fever, fatigue or weakness.   EYES: No blurred or double vision.  EARS, NOSE, AND THROAT: No tinnitus or ear pain.  RESPIRATORY: No cough, shortness of breath, wheezing or hemoptysis.  CARDIOVASCULAR: No chest pain, orthopnea, edema.  GASTROINTESTINAL: No nausea, vomiting, diarrhea or abdominal pain.  GENITOURINARY: No dysuria, hematuria.  ENDOCRINE: No polyuria, nocturia,  HEMATOLOGY: No anemia, easy bruising or bleeding SKIN: No rash or lesion. MUSCULOSKELETAL: No joint pain or arthritis.   NEUROLOGIC: No tingling, numbness, weakness.  PSYCHIATRY: No anxiety or depression.   MEDICATIONS AT HOME:  Prior to Admission medications   Medication Sig Start Date End Date Taking? Authorizing Provider  acetaminophen (TYLENOL) 325 MG tablet Take 650 mg by mouth every 4 (four) hours as needed for mild pain, fever or headache.    Yes Historical Provider, MD  bisacodyl (BISACODYL) 5 MG EC tablet Take 10 mg by mouth daily as needed for moderate constipation.   Yes Historical Provider, MD  donepezil (ARICEPT) 5 MG tablet Take 5 mg by mouth at bedtime.   Yes Historical Provider, MD  mirtazapine (REMERON) 15 MG tablet Take 15 mg by mouth at bedtime.   Yes Historical Provider, MD  neomycin-bacitracin-polymyxin (NEOSPORIN) [phone] ointment Apply 1 application topically daily. Pt applies to right upper arm.   Yes Historical Provider, MD  omeprazole (PRILOSEC) 20 MG capsule Take 20 mg by mouth daily.  Yes Historical Provider, MD  potassium chloride (K-DUR,KLOR-CON) 10 MEQ tablet Take 10 mEq by mouth daily.   Yes Historical Provider, MD  QUEtiapine (SEROQUEL) 50 MG tablet Take 50 mg by mouth 2 (two) times daily.   Yes Historical Provider, MD  Vitamin D, Ergocalciferol, (DRISDOL) 50000 units CAPS capsule Take 50,000 Units by mouth once a week. Pt takes on Tuesday.   Yes Historical Provider, MD  cephALEXin (KEFLEX) 500 MG capsule Take 1 capsule (500 mg total) by mouth 2 (two) times daily. Patient not taking: Reported on 06/23/2015  03/10/15   Lisa Roca, MD      PHYSICAL EXAMINATION:   VITAL SIGNS: Blood pressure 134/77, pulse 45, temperature 98.2 F (36.8 C), temperature source Oral, resp. rate 14, height 5\' 11"  (1.803 m), weight 76.703 kg (169 lb 1.6 oz), SpO2 97 %.  GENERAL:  82 y.o.-year-old patient lying in the bed with no acute distress.  EYES: Pupils equal, round, reactive to light and accommodation. No scleral icterus. Extraocular muscles intact.  HEENT: Head atraumatic, normocephalic. Oropharynx and nasopharynx clear.  NECK:  Supple, no jugular venous distention. No thyroid enlargement, no tenderness.  LUNGS: Normal breath sounds bilaterally, no wheezing, rales,rhonchi or crepitation. No use of accessory muscles of respiration.  CARDIOVASCULAR: S1, S2 bradycardia noted. No murmurs, rubs, or gallops.  ABDOMEN: Soft, nontender, nondistended. Bowel sounds present. No organomegaly or mass.  EXTREMITIES: No pedal edema, cyanosis, or clubbing.  NEUROLOGIC: Cranial nerves II through XII are intact. Muscle strength 5/5 in all extremities. Sensation intact. Dementia noted, No cerebellar signs noted. PSYCHIATRIC: The patient is alert and oriented x 3, mood pleasant SKIN: small skin tear over right wrist  LABORATORY PANEL:   CBC  Recent Labs Lab 06/23/15 0127  WBC 6.0  HGB 11.7*  HCT 35.1*  PLT 181  MCV 80.4  MCH 26.8  MCHC 33.3  RDW 17.7*  LYMPHSABS 1.0  MONOABS 0.5  EOSABS 0.3  BASOSABS 0.0   ------------------------------------------------------------------------------------------------------------------  Chemistries   Recent Labs Lab 06/23/15 0127  NA 143  K 3.7  CL 109  CO2 26  GLUCOSE 109*  BUN 14  CREATININE 1.05  CALCIUM 9.3  MG 1.4*  AST 23  ALT 12*  ALKPHOS 81  BILITOT 0.5   ------------------------------------------------------------------------------------------------------------------ estimated creatinine clearance is 47.8 mL/min (by C-G formula based on Cr of  1.05). ------------------------------------------------------------------------------------------------------------------ No results for input(s): TSH, T4TOTAL, T3FREE, THYROIDAB in the last 72 hours.  Invalid input(s): FREET3   Coagulation profile  Recent Labs Lab 06/23/15 0127  INR 1.27   ------------------------------------------------------------------------------------------------------------------- No results for input(s): DDIMER in the last 72 hours. -------------------------------------------------------------------------------------------------------------------  Cardiac Enzymes  Recent Labs Lab 06/23/15 0127  TROPONINI 0.03   ------------------------------------------------------------------------------------------------------------------ Invalid input(s): POCBNP  ---------------------------------------------------------------------------------------------------------------  Urinalysis    Component Value Date/Time   COLORURINE YELLOW* 03/10/2015 0800   APPEARANCEUR CLOUDY* 03/10/2015 0800   LABSPEC 1.016 03/10/2015 0800   PHURINE 5.0 03/10/2015 0800   GLUCOSEU NEGATIVE 03/10/2015 0800   HGBUR 3+* 03/10/2015 0800   BILIRUBINUR NEGATIVE 03/10/2015 0800   KETONESUR NEGATIVE 03/10/2015 0800   PROTEINUR NEGATIVE 03/10/2015 0800   NITRITE NEGATIVE 03/10/2015 0800   LEUKOCYTESUR 3+* 03/10/2015 0800     RADIOLOGY: Ct Head Wo Contrast  06/23/2015  CLINICAL DATA:  Status post unwitnessed fall, with concern for head injury. Initial encounter. EXAM: CT HEAD WITHOUT CONTRAST TECHNIQUE: Contiguous axial images were obtained from the base of the skull through the vertex without intravenous contrast. COMPARISON:  CT of the head performed 03/19/2015 FINDINGS:  There is no evidence of acute infarction, mass lesion, or intra- or extra-axial hemorrhage on CT. Prominence of the ventricles and sulci reflects moderately severe cortical volume loss. Cerebellar atrophy is noted.  Scattered periventricular and subcortical white matter change likely reflects small vessel ischemic microangiopathy. The brainstem and fourth ventricle are within normal limits. The basal ganglia are unremarkable in appearance. The cerebral hemispheres demonstrate grossly normal gray-white differentiation. No mass effect or midline shift is seen. There is no evidence of fracture; visualized osseous structures are unremarkable in appearance. The orbits are within normal limits. The paranasal sinuses and mastoid air cells are well-aerated. No significant soft tissue abnormalities are seen. IMPRESSION: 1. No acute intracranial pathology seen on CT. 2. Moderately severe cortical volume loss and scattered small vessel ischemic microangiopathy. Electronically Signed   By: Garald Balding M.D.   On: 06/23/2015 01:41    EKG: Orders placed or performed during the hospital encounter of 06/22/15  . ED EKG  . ED EKG  . EKG 12-Lead  . EKG 12-Lead  . EKG 12-Lead  . EKG 12-Lead    IMPRESSION AND PLAN: 82 year old male patient with history of dementia, prostate cancer, nephrolithiasis, anemia presented to the emergency room with fall. Evaluation in the emergency room showed bradycardia. Admitting diagnosis 1. Accidental fall 2. Syncope 3. Bradycardia 4. Dementia Treatment plan Admit patient to telemetry observation bed IV fluid hydration Monitor heart rate on telemetry Cardiology consultation for bradycardia Check echocardiogram to assess left ventricle function Cycle troponin to rule out ischemia DVT prophylaxis with subcutaneous Lovenox 40 MG daily Aspirin 81 mg orally daily Supportive care.  All the records are reviewed and case discussed with ED provider. Management plans discussed with the patient, family and they are in agreement.  CODE STATUS:DNR Code Status History    This patient does not have a recorded code status. Please follow your organizational policy for patients in this situation.     Advance Directive Documentation        Most Recent Value   Type of Advance Directive  Out of facility DNR (pink MOST or yellow form)   Pre-existing out of facility DNR order (yellow form or pink MOST form)     "MOST" Form in Place?         TOTAL TIME TAKING CARE OF THIS PATIENT: 51 minutes.    Saundra Shelling M.D on 06/23/2015 at 2:41 AM  Between 7am to 6pm - Pager - [phone]  After 6pm go to [email or web address] - password [email or web address] [facility]  [facility]  Office  [phone]  CC: Primary care physician; No primary care provider on file.

## 2015-06-23 NOTE — Consult Note (Signed)
CARDIOLOGY CONSULT NOTE  Patient ID: Cody Morein., MRN: [id number], DOB/AGE: 80-Oct-1922 80 y.o. Admit date: 06/22/2015 Date of Consult: 06/23/2015  Primary Physician: No primary care provider on file. Primary Cardiologist: none Consulting Physician MODY  Chief Complaint: fall   HPI Cody Gordon. is a 82 y.o. male  Admitted from the emergency room after a fall at the nursing home. The patient is unable to give a history and the details are obtained from the prior medical record. The records are scant.  The patient denies prior falls;  however, notes from 2015 describes prior falls.  Reviewing the medical record the patient has had long-standing sinus bradycardia with heart rates in the 40s. He also has a history of right bundle branch block Upon arrival to the ER yesterday, with his heart rate in the 40s, it was elected to admit him.  Past medical history is notable for dementia for which he takes Aricept. Duration is not known.  Also hx of DVT with prior PE and prev IVC filter       Past Medical History  Diagnosis Date  . Neoplasm, bladder   . Prostate cancer ([place])   . Tachycardia     bradycardia  . Nephrolithiasis   . Incomplete emptying of bladder   . Hematuria   . Dementia   . Anemia       Surgical History:  Past Surgical History  Procedure Laterality Date  . Back surgery       Home Meds: Prior to Admission medications   Medication Sig Start Date End Date Taking? Authorizing Provider  acetaminophen (TYLENOL) 325 MG tablet Take 650 mg by mouth every 4 (four) hours as needed for mild pain, fever or headache.    Yes Historical Provider, MD  bisacodyl (BISACODYL) 5 MG EC tablet Take 10 mg by mouth daily as needed for moderate constipation.   Yes Historical Provider, MD  donepezil (ARICEPT) 5 MG tablet Take 5 mg by mouth at bedtime.   Yes Historical Provider, MD  mirtazapine (REMERON) 15 MG tablet Take 15 mg by mouth at bedtime.   Yes Historical  Provider, MD  neomycin-bacitracin-polymyxin (NEOSPORIN) [phone] ointment Apply 1 application topically daily. Pt applies to right upper arm.   Yes Historical Provider, MD  omeprazole (PRILOSEC) 20 MG capsule Take 20 mg by mouth daily.   Yes Historical Provider, MD  potassium chloride (K-DUR,KLOR-CON) 10 MEQ tablet Take 10 mEq by mouth daily.   Yes Historical Provider, MD  QUEtiapine (SEROQUEL) 50 MG tablet Take 50 mg by mouth 2 (two) times daily.   Yes Historical Provider, MD  Vitamin D, Ergocalciferol, (DRISDOL) 50000 units CAPS capsule Take 50,000 Units by mouth once a week. Pt takes on Tuesday.   Yes Historical Provider, MD  cephALEXin (KEFLEX) 500 MG capsule Take 1 capsule (500 mg total) by mouth 2 (two) times daily. Patient not taking: Reported on 06/23/2015 03/10/15   Lisa Roca, MD    Inpatient Medications:  . aspirin  81 mg Oral Daily  . donepezil  5 mg Oral QHS  . enoxaparin (LOVENOX) injection  40 mg Subcutaneous Daily  . mirtazapine  15 mg Oral QHS  . pantoprazole  40 mg Oral QAC breakfast  . QUEtiapine  50 mg Oral BID  . sodium chloride flush  3 mL Intravenous Q12H  . TRIPLE ANTIBIOTIC  1 application Topical Daily  . [START ON 06/26/2015] Vitamin D (Ergocalciferol)  50,000 Units Oral Weekly  Allergies: No Known Allergies  Social History   Social History  . Marital Status: Married    Spouse Name: N/A  . Number of Children: N/A  . Years of Education: N/A   Occupational History  . retired    Social History Main Topics  . Smoking status: Never Smoker   . Smokeless tobacco: Not on file     Comment: tobacco use -no  . Alcohol Use: No  . Drug Use: No  . Sexual Activity: Not on file   Other Topics Concern  . Not on file   Social History Narrative   Married, retired, does not get regular exercise.      Family History  Problem Relation Age of Onset  . Hypertension Father     deceased     ROS:  Please see the history of present illness.     All other  systems reviewed and negative.    Physical Exam:   Blood pressure 138/81, pulse 46, temperature 97.9 F (36.6 C), temperature source Oral, resp. rate 16, height 5\' 11"  (1.803 m), weight 155 lb 12.8 oz (70.67 kg), SpO2 97 %. General: Well developed, well nourished male in no acute distress. Head: Normocephalic, atraumatic, sclera non-icteric, no xanthomas, nares are without discharge. EENT: normal  Lymph Nodes:  none Neck: Negative for carotid bruits. JVD not elevated. Back:without scoliosis kyphosis  Lungs: Clear bilaterally to auscultation without wheezes, rales, or rhonchi. Breathing is unlabored. Heart: RRR with S1 S2.   2/6 systolic  murmur . No rubs, or gallops appreciated. Abdomen: Soft, diffuse tenderness , non-distended with normoactive bowel sounds. No hepatomegaly. No rebound but guarding. No obvious abdominal masses. Msk:  Strength and tone appear normal for age. Extremities: No clubbing or cyanosis. No  edema.  Distal pedal pulses are 2+ and equal bilaterally. Skin: Warm and Dry Neuro: Alert and oriented X 1. CN III-XII intact Grossly normal sensory and motor function . Psych:  Responds to questions some answers are appropriate and others are not     Labs: Cardiac Enzymes  Recent Labs  06/23/15 0127 06/23/15 0458 06/23/15 1045  TROPONINI 0.03 0.03 0.03   CBC Lab Results  Component Value Date   WBC 6.0 06/23/2015   HGB 11.7* 06/23/2015   HCT 35.1* 06/23/2015   MCV 80.4 06/23/2015   PLT 181 06/23/2015   PROTIME:  Recent Labs  06/23/15 0127  LABPROT 16.0*  INR 1.27   Chemistry   Recent Labs Lab 06/23/15 0127  NA 143  K 3.7  CL 109  CO2 26  BUN 14  CREATININE 1.05  CALCIUM 9.3  PROT 6.1*  BILITOT 0.5  ALKPHOS 81  ALT 12*  AST 23  GLUCOSE 109*   Lipids No results found for: CHOL, HDL, LDLCALC, TRIG BNP No results found for: PROBNP Thyroid Function Tests: No results for input(s): TSH, T4TOTAL, T3FREE, THYROIDAB in the last 72  hours.  Invalid input(s): FREET3 Miscellaneous No results found for: DDIMER  Radiology/Studies:  Ct Head Wo Contrast  06/23/2015  CLINICAL DATA:  Status post unwitnessed fall, with concern for head injury. Initial encounter. EXAM: CT HEAD WITHOUT CONTRAST TECHNIQUE: Contiguous axial images were obtained from the base of the skull through the vertex without intravenous contrast. COMPARISON:  CT of the head performed 03/19/2015 FINDINGS: There is no evidence of acute infarction, mass lesion, or intra- or extra-axial hemorrhage on CT. Prominence of the ventricles and sulci reflects moderately severe cortical volume loss. Cerebellar atrophy is noted. Scattered periventricular and subcortical  white matter change likely reflects small vessel ischemic microangiopathy. The brainstem and fourth ventricle are within normal limits. The basal ganglia are unremarkable in appearance. The cerebral hemispheres demonstrate grossly normal gray-white differentiation. No mass effect or midline shift is seen. There is no evidence of fracture; visualized osseous structures are unremarkable in appearance. The orbits are within normal limits. The paranasal sinuses and mastoid air cells are well-aerated. No significant soft tissue abnormalities are seen. IMPRESSION: 1. No acute intracranial pathology seen on CT. 2. Moderately severe cortical volume loss and scattered small vessel ischemic microangiopathy. Electronically Signed   By: Garald Balding M.D.   On: 06/23/2015 01:41    EKG: sinus brady 40 RBBB  Telemetry-sinus rhythm with rates ranging from high 30s--low 80s  Assessment and Plan:  Sinus bradycardia/right bundle branch block  Hypertension  Dementia on Aricept  Fall question syncope    The patient has long-standing sinus bradycardia with heart rates identified on electrocardiogram is far back as 2016 with heart rates in the 40s. It is hard thus to implicate the bradycardia noted on his tracings as the cause  of his fall. It is certainly possible that he has intermittent bradycardia that is more severe that could have been responsible for his fall. It is also noteworthy that Aricept can aggravate sinus bradycardia not uncommonly; Remeron does it rarely  Hence, initial recommendation will be to discontinue the Aricept.    Outpatient 30 day monitoring if possible at his nursing home would also be useful if in-hospital telemetry is unrevealing. If his history of falls is really frequent this can be  illuminating. I suspect it will not be. We can arrange this on Monday.  Echocardiogram is pending    Virl Axe

## 2015-06-23 NOTE — ED Notes (Signed)
Pt's HR noted to be 38.  Unable to find Dr. Burlene Arnt at this time, Dr. Cinda Quest told, per [place], Sander Nephew and atropine at bedside.

## 2015-06-23 NOTE — Progress Notes (Signed)
*  PRELIMINARY RESULTS* Echocardiogram 2D Echocardiogram has been performed.  Cody Gordon 06/23/2015, 8:45 AM

## 2015-06-23 NOTE — Progress Notes (Signed)
Pt. Combative punching and kicking at staff, swinging telemetry box at staff, pulling at  IV line, yelling for the females to get out. A code 300 was called and security arrived. The male staff went outside the room door as the security men attempted to calm the pt. Dr. Elisha Ponder was paged and ordered 20mg  geodon IM, It was not given R/T patients sinus brady. Pt. Finally began to calm down but would not allow telemetry to be re-applied. Safety rounder and nurse made frequent rounds with pt. Bed alarm on R/T pt's impulsiveness. Will contineu to montior pt.

## 2015-06-24 DIAGNOSIS — R001 Bradycardia, unspecified: Secondary | ICD-10-CM | POA: Diagnosis not present

## 2015-06-24 LAB — PROTIME-INR
INR: 1.19
PROTHROMBIN TIME: 15.3 s — AB (ref 11.4–15.0)

## 2015-06-24 LAB — MRSA PCR SCREENING: MRSA by PCR: NEGATIVE

## 2015-06-24 MED ORDER — HALOPERIDOL LACTATE 5 MG/ML IJ SOLN
2.0000 mg | Freq: Four times a day (QID) | INTRAMUSCULAR | Status: DC | PRN
Start: 2015-06-24 — End: 2015-06-26
  Administered 2015-06-24 – 2015-06-25 (×2): 2 mg via INTRAVENOUS
  Filled 2015-06-24 (×4): qty 1

## 2015-06-24 MED ORDER — HALOPERIDOL LACTATE 5 MG/ML IJ SOLN
2.0000 mg | Freq: Once | INTRAMUSCULAR | Status: AC
  Administered 2015-06-24: 2 mg via INTRAVENOUS
  Filled 2015-06-24: qty 1

## 2015-06-24 NOTE — Progress Notes (Signed)
Pt. Became combative kicking, punching and attempting to bite staff members. Dr. Marcille Blanco at beside. Haldol ordered, haldol administered. Dr. Marcille Blanco also put a hold on the IV infusion at this time. Pt. continues to refuse telemetry at this time.

## 2015-06-24 NOTE — Progress Notes (Signed)
Bladder scan showed 390 mL, shortly after patient voided and had BM (incontinent). Not able to measure, but large amount of urine and BM recorded. Patient did not have in and out cath. Next bladder scan is at 0000. Night shift given report on new information.  Cody Gordon

## 2015-06-24 NOTE — Progress Notes (Signed)
Patient Name: Cody Gordon.      SUBJECTIVE:    The patient has long-standing sinus bradycardia with heart rates identified on electrocardiogram is far back as 2016 with heart rates in the 40s. It is hard thus to implicate the bradycardia noted on his tracings as the cause of his fall. ARicept held -- T 1/2 is 70 hrs so it will be 2 weeks before it is gone from his system Asleep this am   Past Medical History  Diagnosis Date  . Neoplasm, bladder   . Prostate cancer ([place])   . Sinus bradycardia     bradycardia  . Nephrolithiasis   . Incomplete emptying of bladder   . Hematuria   . Dementia   . Anemia   . DVT (deep venous thrombosis) ([facility])     PE also  . 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 Diverticulosis of intestine with bleeding     Scheduled Meds:  Scheduled Meds: . aspirin  81 mg Oral Daily  . enoxaparin (LOVENOX) injection  40 mg Subcutaneous Daily  . mirtazapine  15 mg Oral QHS  . pantoprazole  40 mg Oral QAC breakfast  . QUEtiapine  50 mg Oral BID  . sodium chloride flush  3 mL Intravenous Q12H  . TRIPLE ANTIBIOTIC  1 application Topical Daily  . [START ON 06/26/2015] Vitamin D (Ergocalciferol)  50,000 Units Oral Weekly   Continuous Infusions: . sodium chloride 75 mL/hr at 06/23/15 0445   acetaminophen, bisacodyl, haloperidol lactate, ondansetron **OR** ondansetron (ZOFRAN) IV    PHYSICAL EXAM Filed Vitals:   06/23/15 0800 06/23/15 1140 06/24/15 0034 06/24/15 0326  BP: 156/94 138/81 165/82 121/62  Pulse: 62 46 77 63  Temp: 98.3 F (36.8 C) 97.9 F (36.6 C) 97.8 F (36.6 C)   TempSrc: Axillary Oral Oral   Resp: 16 16 22    Height:      Weight:      SpO2: 94% 97% 98% 95%   Elderly sleeping in no acute distress HENT normal Neck supple  Clear Slow but Regular rate and rhythm, no murmurs or gallops Abd-soft with active BS No Clubbing cyanosis  Skin-warm and dry Asleep and not awakened   TELEMETRY: Reviewed telemetry  pt in *sinus brady  Echo normal LVV function  Mild MR   Intake/Output Summary (Last 24 hours) at 06/24/15 1129 Last data filed at 06/24/15 0948  Gross per 24 hour  Intake    375 ml  Output    627 ml  Net   -252 ml    LABS: Basic Metabolic Panel:  Recent Labs Lab 06/23/15 0127  NA 143  K 3.7  CL 109  CO2 26  GLUCOSE 109*  BUN 14  CREATININE 1.05  CALCIUM 9.3  MG 1.4*   Cardiac Enzymes:  Recent Labs  06/23/15 0458 06/23/15 1045 06/23/15 1625  TROPONINI 0.03 0.03 0.03   CBC:  Recent Labs Lab 06/23/15 0127  WBC 6.0  NEUTROABS 4.2  HGB 11.7*  HCT 35.1*  MCV 80.4  PLT 181   PROTIME:  Recent Labs  06/23/15 0127 06/24/15 0523  LABPROT 16.0* 15.3*  INR 1.27 1.19   Liver Function Tests:  Recent Labs  06/23/15 0127  AST 23  ALT 12*  ALKPHOS 81  BILITOT 0.5  PROT 6.1*  ALBUMIN 3.7   No results for input(s): LIPASE, AMYLASE in the last 72 hours. BNP: BNP (last 3 results) No results for  input(s): BNP in the last 8760 hours.  ProBNP (last 3 results) No results for input(s): PROBNP in the last 8760 hours.  D-Dimer: No results for input(s): DDIMER in the last 72 hours. Hemoglobin A1C: No results for input(s): HGBA1C in the last 72 hours. Fasting Lipid Panel:    ASSESSMENT AND PLAN: Sinus bradycardia/right bundle branch block  Hypertension  Dementia on Aricept  Fall question syncope   No other recs  Continue tele while in house We can use 30 d monitor as outpt      Signed, Virl Axe MD  06/24/2015

## 2015-06-24 NOTE — Progress Notes (Signed)
Pt. Has settle down some, was able to 50mg  of Seroquel in apple sauce crushed. Will continue to monitor pt. Safety rounder contineus.

## 2015-06-24 NOTE — Progress Notes (Signed)
Pt. Still impulsive and requiring a safety rounder. He still will not allow tele to be applied. He pulled his IV line apart breaking it into. Dr. Marcille Blanco made aware and updated on Dr. Dayton Bailiff orders. Dr. Marcille Blanco said to encourage pt. To rest and attempt to re-apply equipment. Bed alarm on. Will continue to monitor pt.

## 2015-06-24 NOTE — Progress Notes (Signed)
Pt. Has not urinated this shift, pt. Bladder scanned with 627 in bladder. Dr. Estanislado Pandy notified and ordered in and out cath. In and out cath return was 600. Pt. Tolerated procedure well. Pt. Resting comfortably in bed at this time with safety rounder at bedside. Pt. continues to refuse telemetry. Will continue to monitor pt.

## 2015-06-24 NOTE — Progress Notes (Signed)
Dr. Posey Pronto notified during morning rounds that patient has trouble voiding and required in and cath over night. Patient does have a history of this. Per Dr. Posey Pronto bladder scan patient and in and cath if >350. Cody Gordon

## 2015-06-24 NOTE — Progress Notes (Addendum)
Dr. Marcille Blanco still at bedside and pt. Continues to be combative, an additional 2mg  haldol ordered, after some time pt calmed down,  pt. Now in wheelchair being pushed around nursing station with safety rounder, pt. Peaceful and quiet at this time.

## 2015-06-24 NOTE — Progress Notes (Signed)
Pt. Up in wheelchair with safety rounder going around nurses station. Pt. Is calm and cooperative at this time.

## 2015-06-24 NOTE — Progress Notes (Signed)
Dr. Posey Pronto notified of bladder scan results of 329, no new orders. Dr. Posey Pronto wants to continue bladder scans, with PRN in and out [place]

## 2015-06-24 NOTE — Progress Notes (Signed)
Pt. Walked around nurses station 3 times, and returned to bed, bed alarm re-appled. Safety sitter continues. Will continue to monitor pt.

## 2015-06-24 NOTE — Clinical Social Work Note (Signed)
CSW consulted as pt was admitted [facility]. Pt is Medicare OBS. Per MD, pt will likely discharge tomorrow (06/25/2015) and return to [facility]. Pt has dementia. CSW left two messages with pt's nephew, Mr. Oletta Cohn requesting a return phone call. CSW contacted pt's great niece as she was listed as a contact. She confirmed Mr. Oletta Cohn is pt's POA and provide his wife's telephone number. CSW was unable to leave a message on Mr. Dismuke's wife phone. FL2 started in anticipation for pt's discharge. Full assessment to follow.   Darden Dates, MSW, [place] Social Worker  [phone]

## 2015-06-24 NOTE — Progress Notes (Signed)
Patient ID: Cody Glatter., male   DOB: 07/23/1920, 82 y.o.   MRN: [id number] [place] [facility] NAME: Cody Gordon    MR#:  [id number]  DATE OF BIRTH:  16-Sep-1920  SUBJECTIVE:   Patient had some episode of restlessness and agitation. Received couple doses of Haldol and Ativan. Currently appears pleasant. Is rolling around in the wheelchair near the nurses Station. Denies any complaints. He has underlying dementia. REVIEW OF SYSTEMS:   Review of Systems  Constitutional: Negative for fever, chills and weight loss.  HENT: Negative for ear discharge, ear pain and nosebleeds.   Eyes: Negative for blurred vision, pain and discharge.  Respiratory: Negative for sputum production, shortness of breath, wheezing and stridor.   Cardiovascular: Negative for chest pain, palpitations, orthopnea and PND.  Gastrointestinal: Negative for nausea, vomiting, abdominal pain and diarrhea.  Genitourinary: Negative for urgency and frequency.  Musculoskeletal: Negative for back pain and joint pain.  Neurological: Negative for sensory change, speech change, focal weakness and weakness.  Psychiatric/Behavioral: Negative for depression and hallucinations. The patient is not nervous/anxious.    Tolerating Diet:yes   DRUG ALLERGIES:  No Known Allergies  VITALS:  Blood pressure 121/62, pulse 63, temperature 97.8 F (36.6 C), temperature source Oral, resp. rate 22, height 5\' 11"  (1.803 m), weight 70.67 kg (155 lb 12.8 oz), SpO2 95 %.  PHYSICAL EXAMINATION:   Physical Exam  GENERAL:  82 y.o.-year-old patient lying in the bed with no acute distress.  EYES: Pupils equal, round, reactive to light and accommodation. No scleral icterus. Extraocular muscles intact.  HEENT: Head atraumatic, normocephalic. Oropharynx and nasopharynx clear.  NECK:  Supple, no jugular venous distention. No thyroid enlargement, no tenderness.  LUNGS: Normal breath sounds  bilaterally, no wheezing, rales, rhonchi. No use of accessory muscles of respiration.  CARDIOVASCULAR: S1, S2 normal. No murmurs, rubs, or gallops.  ABDOMEN: Soft, nontender, nondistended. Bowel sounds present. No organomegaly or mass.  EXTREMITIES: No cyanosis, clubbing or edema b/l.    NEUROLOGIC: Cranial nerves II through XII are intact. No focal Motor or sensory deficits b/l.   PSYCHIATRIC:  patient is alert and oriented x 3.  SKIN: No obvious rash, lesion, or ulcer.   LABORATORY PANEL:  CBC  Recent Labs Lab 06/23/15 0127  WBC 6.0  HGB 11.7*  HCT 35.1*  PLT 181    Chemistries   Recent Labs Lab 06/23/15 0127  NA 143  K 3.7  CL 109  CO2 26  GLUCOSE 109*  BUN 14  CREATININE 1.05  CALCIUM 9.3  MG 1.4*  AST 23  ALT 12*  ALKPHOS 81  BILITOT 0.5   Cardiac Enzymes  Recent Labs Lab 06/23/15 1625  TROPONINI 0.03   RADIOLOGY:  Ct Head Wo Contrast  06/23/2015  CLINICAL DATA:  Status post unwitnessed fall, with concern for head injury. Initial encounter. EXAM: CT HEAD WITHOUT CONTRAST TECHNIQUE: Contiguous axial images were obtained from the base of the skull through the vertex without intravenous contrast. COMPARISON:  CT of the head performed 03/19/2015 FINDINGS: There is no evidence of acute infarction, mass lesion, or intra- or extra-axial hemorrhage on CT. Prominence of the ventricles and sulci reflects moderately severe cortical volume loss. Cerebellar atrophy is noted. Scattered periventricular and subcortical white matter change likely reflects small vessel ischemic microangiopathy. The brainstem and fourth ventricle are within normal limits. The basal ganglia are unremarkable in appearance. The cerebral hemispheres demonstrate grossly normal gray-white differentiation. No mass effect  or midline shift is seen. There is no evidence of fracture; visualized osseous structures are unremarkable in appearance. The orbits are within normal limits. The paranasal sinuses and  mastoid air cells are well-aerated. No significant soft tissue abnormalities are seen. IMPRESSION: 1. No acute intracranial pathology seen on CT. 2. Moderately severe cortical volume loss and scattered small vessel ischemic microangiopathy. Electronically Signed   By: Garald Balding M.D.   On: 06/23/2015 01:41   ASSESSMENT AND PLAN:  82 year old male patient with history of dementia, prostate cancer, nephrolithiasis, anemia presented to the emergency room with fall. Evaluation in the emergency room showed bradycardia. Admitting diagnosis 1. Accidental fall -To be seen by PT. 2. Syncope -No obvious source found other than intermittent bradycardia  3. Bradycardia -Patient's heart rate is anywhere from 50-63. He does not let telemetry monitor stay on him pulling it frequently. Telemetry monitor removed. Appreciated cardiology input. Discontinued Aricept. -Patient will need to set up outpatient Holter monitoring for 30 day this will be set up by [facility].  4. Dementia  Patient will return back to  house tomorrow Case discussed with Care Management/Social Worker. Management plans discussed with the patient, family and they are in agreement.  CODE STATUS: dnr  DVT Prophylaxis: lovenox  TOTAL TIME TAKING CARE OF THIS PATIENT: 30 minutes.  >50% time spent on counselling and coordination of care  POSSIBLE D/C IN 1 DAYS, DEPENDING ON CLINICAL CONDITION.  Note: This dictation was prepared with Dragon dictation along with smaller phrase technology. Any transcriptional errors that result from this process are unintentional.  Cody Gordon M.D on 06/24/2015 at 11:16 AM  Between 7am to 6pm - Pager - [phone]  After 6pm go to [email or web address] - password [email or web address]  [facility]  Office  [phone]  CC: Primary care physician; No primary care provider on file.

## 2015-06-25 ENCOUNTER — Other Ambulatory Visit: Payer: Self-pay

## 2015-06-25 ENCOUNTER — Telehealth: Payer: Self-pay | Admitting: Internal Medicine

## 2015-06-25 ENCOUNTER — Encounter: Payer: Self-pay | Admitting: Student

## 2015-06-25 ENCOUNTER — Telehealth: Payer: Self-pay | Admitting: Student

## 2015-06-25 ENCOUNTER — Other Ambulatory Visit: Payer: Self-pay | Admitting: Student

## 2015-06-25 DIAGNOSIS — R55 Syncope and collapse: Secondary | ICD-10-CM

## 2015-06-25 DIAGNOSIS — R001 Bradycardia, unspecified: Secondary | ICD-10-CM | POA: Diagnosis not present

## 2015-06-25 LAB — MAGNESIUM: Magnesium: 3.2 mg/dL — ABNORMAL HIGH (ref 1.7–2.4)

## 2015-06-25 MED ORDER — MAGNESIUM SULFATE 4 GM/100ML IV SOLN
4.0000 g | Freq: Once | INTRAVENOUS | Status: AC
  Administered 2015-06-25: 4 g via INTRAVENOUS
  Filled 2015-06-25: qty 100

## 2015-06-25 MED ORDER — HALOPERIDOL 0.5 MG PO TABS
1.0000 mg | ORAL_TABLET | Freq: Three times a day (TID) | ORAL | Status: DC | PRN
  Administered 2015-06-25: 1 mg via ORAL

## 2015-06-25 NOTE — Progress Notes (Signed)
Hospital Problem List     Principal Problem:   Fall Active Problems:   Bradycardia   Pressure ulcer    Patient Profile:   Primary Cardiologist: New - Dr. Caryl Comes  82 yo male w/ PMH of prostate cancer, DVT's/PE (s/p IVC filter placement), and dementia admitted on 06/22/2015 for a fall/possible syncope. Noted to be bradycardiac into the 40's this admission.  Subjective   Denies any chest pain, shortness of breath, or palpitations. Anticipated discharge today.  Inpatient Medications    . aspirin  81 mg Oral Daily  . enoxaparin (LOVENOX) injection  40 mg Subcutaneous Daily  . mirtazapine  15 mg Oral QHS  . pantoprazole  40 mg Oral QAC breakfast  . QUEtiapine  50 mg Oral BID  . sodium chloride flush  3 mL Intravenous Q12H  . TRIPLE ANTIBIOTIC  1 application Topical Daily  . [START ON 06/26/2015] Vitamin D (Ergocalciferol)  50,000 Units Oral Weekly    Vital Signs    Filed Vitals:   06/24/15 0326 06/24/15 1159 06/24/15 2000 06/25/15 0431  BP: 121/62 123/86 133/74 132/68  Pulse: 63  58 38  Temp:  97.6 F (36.4 C) 97.3 F (36.3 C) 98.2 F (36.8 C)  TempSrc:  Oral Oral Oral  Resp:  18 18 18   Height:      Weight:      SpO2: 95%  97% 96%    Intake/Output Summary (Last 24 hours) at 06/25/15 1009 Last data filed at 06/25/15 0932  Gross per 24 hour  Intake    240 ml  Output    350 ml  Net   -110 ml   Filed Weights   06/22/15 2348 06/23/15 0438  Weight: 169 lb 1.6 oz (76.703 kg) 155 lb 12.8 oz (70.67 kg)    Physical Exam    General: Elderly Caucasian  male appearing in no acute distress. Head: Normocephalic, atraumatic.  Neck: Supple without bruits, JVD not elevated. Lungs:  Resp regular and unlabored, CTA without wheezing or rales. Heart: Regular rhythm, bradycardiac, S1, S2, no S3, S4, or murmur; no rub. Abdomen: Soft, non-tender, non-distended with normoactive bowel sounds. No hepatomegaly. No rebound/guarding. No obvious abdominal masses. Extremities: No  clubbing, cyanosis, or edema. Distal pedal pulses are 2+ bilaterally. Neuro: Alert and oriented X 3. Moves all extremities spontaneously. Psych: Normal affect.  Labs    CBC  Recent Labs  06/23/15 0127  WBC 6.0  NEUTROABS 4.2  HGB 11.7*  HCT 35.1*  MCV 80.4  PLT 0000000   Basic Metabolic Panel  Recent Labs  06/23/15 0127  NA 143  K 3.7  CL 109  CO2 26  GLUCOSE 109*  BUN 14  CREATININE 1.05  CALCIUM 9.3  MG 1.4*   Liver Function Tests  Recent Labs  06/23/15 0127  AST 23  ALT 12*  ALKPHOS 81  BILITOT 0.5  PROT 6.1*  ALBUMIN 3.7   No results for input(s): LIPASE, AMYLASE in the last 72 hours. Cardiac Enzymes  Recent Labs  06/23/15 0458 06/23/15 1045 06/23/15 1625  TROPONINI 0.03 0.03 0.03    Telemetry    Sinus bradycardia, trough of 37. Mostly in the mid-40's. No other atopic events.  ECG    No new tracings.   Cardiac Studies and Radiology    Ct Head Wo Contrast: 06/23/2015  CLINICAL DATA:  Status post unwitnessed fall, with concern for head injury. Initial encounter. EXAM: CT HEAD WITHOUT CONTRAST TECHNIQUE: Contiguous axial images were obtained from the base  of the skull through the vertex without intravenous contrast. COMPARISON:  CT of the head performed 03/19/2015 FINDINGS: There is no evidence of acute infarction, mass lesion, or intra- or extra-axial hemorrhage on CT. Prominence of the ventricles and sulci reflects moderately severe cortical volume loss. Cerebellar atrophy is noted. Scattered periventricular and subcortical white matter change likely reflects small vessel ischemic microangiopathy. The brainstem and fourth ventricle are within normal limits. The basal ganglia are unremarkable in appearance. The cerebral hemispheres demonstrate grossly normal gray-white differentiation. No mass effect or midline shift is seen. There is no evidence of fracture; visualized osseous structures are unremarkable in appearance. The orbits are within normal  limits. The paranasal sinuses and mastoid air cells are well-aerated. No significant soft tissue abnormalities are seen. IMPRESSION: 1. No acute intracranial pathology seen on CT. 2. Moderately severe cortical volume loss and scattered small vessel ischemic microangiopathy. Electronically Signed   By: Garald Balding M.D.   On: 06/23/2015 01:41    Echocardiogram: 06/23/2015 Study Conclusions - Procedure narrative: Transthoracic echocardiography. Image  quality was poor. The study was technically difficult, as a  result of poor acoustic windows. - Left ventricle: The cavity size was normal. Systolic function was  normal. The estimated ejection fraction was in the range of 50%  to 55%. Mild anteroseptal wall motion abnormality consistent with  conduction abnormality The study is not technically sufficient to  allow evaluation of LV diastolic function. - Mitral valve: There was mild regurgitation. - Left atrium: The atrium was normal in size. - Right ventricle: Systolic function was normal. - Pulmonary arteries: Systolic pressure was within the normal  range.  Impressions: - Sinus bradycardia noted.  Assessment & Plan    1. Fall/ Possible Syncope - sinus bradycardia noted on monitor. No other atopic events.  - have arranged for 30-day cardiac event monitor to be applied prior to discharge today.  2. Dementia - was on Aricept prior to admission. Held in setting of bradycardia. Dr. Caryl Comes made note the half-life is 70 hours, so will not be fully out of his system for 2 weeks.   Signed, Erma Heritage , PA-C 10:09 AM 06/25/2015 Pager: [phone]

## 2015-06-25 NOTE — NC FL2 (Signed)
  [place] LEVEL OF CARE SCREENING TOOL     IDENTIFICATION  Patient Name: Cody Gordon. Birthdate: 1921-01-13 Sex: male Admission Date (Current Location): 06/22/2015  [place] and [place] Number:  Engineering geologist and Address:  [facility], [address], [place], [place] [address]      Provider Number: [id number]  Attending Physician Name and Address:  Demetrios Loll, MD  Relative Name and Phone Number:       Current Level of Care: Hospital Recommended Level of Care: [place] Prior Approval Number:    Date Approved/Denied:   PASRR Number:    Discharge Plan:  (ALF)    Current Diagnoses: Patient Active Problem List   Diagnosis Date Noted  . Fall 06/23/2015  . Bradycardia 06/23/2015  . Pressure ulcer 06/23/2015  . CARDIAC ARRHYTHMIA 11/14/2009  . DIZZINESS 11/14/2009  . SHORTNESS OF BREATH 11/14/2009    Orientation RESPIRATION BLADDER Height & Weight     Self  Normal Continent Weight: 155 lb 12.8 oz (70.67 kg) Height:  5\' 11"  (180.3 cm)  BEHAVIORAL SYMPTOMS/MOOD NEUROLOGICAL BOWEL NUTRITION STATUS   (none)  (none) Continent Diet (regular)  AMBULATORY STATUS COMMUNICATION OF NEEDS Skin   Extensive Assist Verbally PU Stage and Appropriate Care                       Personal Care Assistance Level of Assistance  Bathing, Dressing, Feeding Bathing Assistance: Limited assistance Feeding assistance: Limited assistance Dressing Assistance: Limited assistance     Functional Limitations Info             SPECIAL CARE FACTORS FREQUENCY                       Contractures Contractures Info: Not present    Additional Factors Info  Code Status, Allergies Code Status Info: DNR Allergies Info: NKA           DISCHARGE MEDICATIONS:   Current Discharge Medication List    CONTINUE these medications which have NOT CHANGED   Details  acetaminophen (TYLENOL) 325 MG tablet Take 650  mg by mouth every 4 (four) hours as needed for mild pain, fever or headache.     bisacodyl (BISACODYL) 5 MG EC tablet Take 10 mg by mouth daily as needed for moderate constipation.    mirtazapine (REMERON) 15 MG tablet Take 15 mg by mouth at bedtime.    neomycin-bacitracin-polymyxin (NEOSPORIN) [phone] ointment Apply 1 application topically daily. Pt applies to right upper arm.    omeprazole (PRILOSEC) 20 MG capsule Take 20 mg by mouth daily.    potassium chloride (K-DUR,KLOR-CON) 10 MEQ tablet Take 10 mEq by mouth daily.    QUEtiapine (SEROQUEL) 50 MG tablet Take 50 mg by mouth 2 (two) times daily.    Vitamin D, Ergocalciferol, (DRISDOL) 50000 units CAPS capsule Take 50,000 Units by mouth once a week. Pt takes on Tuesday.      STOP taking these medications     donepezil (ARICEPT) 5 MG tablet      cephALEXin (KEFLEX) 500 MG capsule              Discharge Medications: Please see discharge summary for a list of discharge medications.  Relevant Imaging Results:  Relevant Lab Results:   Additional Information    Shela Leff, LCSW

## 2015-06-25 NOTE — Clinical Social Work Note (Signed)
Clinical Social Work Assessment  Patient Details  Name: Cody Gordon. MRN: [id number] Date of Birth: 01-31-1921  Date of referral:  06/25/15               Reason for consult:  Facility Placement                Permission sought to share information with:   (patient with advanced dementia) Permission granted to share information::     Name::        Agency::     Relationship::     Contact Information:     Housing/Transportation Living arrangements for the past 2 months:  [place] of Information:  Facility, Other (Comment Required) Barrister's clerk) Patient Interpreter Needed:  None Criminal Activity/Legal Involvement Pertinent to Current Situation/Hospitalization:  No - Comment as needed Significant Relationships:  Other(Comment), Spouse (nephew) Lives with:  Facility Resident Do you feel safe going back to the place where you live?  Yes Need for family participation in patient care:  Yes (Comment)  Care giving concerns:  Patient resides at [place]   Social Worker assessment / plan:  CSW informed by MD that patient is to discharge today to return to [place]. Patient has been observation only. CSW spoke with Laverne at [facility] and she stated patient could return and that patient and his wife were living in their assisted living area but that patient needed to be in their memory care unit. She stated that patient's wife was recently sent to SNF and they did not wish to change his surroundings at that time. Laverne stated that they do not provide transportation but that patient can return by EMS. FL2 faxed to [facility] for review. CSW has contacted patient's nephew: [name] and he is aware of discharge and in agreement with return and return by EMS.  Employment status:  Retired Forensic scientist:  Medicare PT Recommendations:  Not assessed at this time Information / Referral to community resources:     Patient/Family's Response to  care:  Patient's nephew expressed appreciation for CSW assistance.  Patient/Family's Understanding of and Emotional Response to Diagnosis, Current Treatment, and Prognosis:  Patient's nephew was very happy that someone notified him of patient's discharge and kept him in the loop.  Emotional Assessment Appearance:  Appears stated age Attitude/Demeanor/Rapport:  Unable to Assess Affect (typically observed):  Unable to Assess Orientation:  Oriented to Self Alcohol / Substance use:  Not Applicable Psych involvement (Current and /or in the community):  No (Comment)  Discharge Needs  Concerns to be addressed:  Care Coordination Readmission within the last 30 days:  No Current discharge risk:  None Barriers to Discharge:  No Barriers Identified   Shela Leff, LCSW 06/25/2015, 11:36 AM

## 2015-06-25 NOTE — Progress Notes (Signed)
  It was recommended the patient receive a 30-day event monitor secondary to a fall and possible syncopal event. After the monitor was applied this AM, he has been pulling off the leads and had episodes of increased agitation requiring IV Ativan.    With this patient being a 82 yo male with dementia and current DNR status, I have asked Jule Ser, RN from our office to remove his monitor this afternoon. Will not be discharged with a cardiac monitor secondary to all of the above.   Signed, Erma Heritage, PA-C 06/25/2015, 4:19 PM Pager: [phone]

## 2015-06-25 NOTE — Care Management (Signed)
Patient presents from [facility]  he has advanced dementia. Placed in observation for syncope/fall.  Discharge order is present.   Spoke with lavern regarding functional status and she stated that his functional status is not an issue and there is no problem with him returning to facility.  Patient is to return with a 30 day event monitoring to rule out arrhythmia as a cause for syncope.  Asked to obtain order for home health nurse through [place].  Spoke with MD regarding order.  Arville Go acknowledges receipt of referral.

## 2015-06-25 NOTE — Progress Notes (Signed)
Patient is confused and agitated. Patient is kicking, punching and attempting to get out of bed. Patient states, "I am going home". Patient is not receptive to diversion activity or therapeutic communication. Code 300 called. Patient given Haldol 1mg  IM. Security is at bedside. Will continue to monitor patient. Horton Finer

## 2015-06-25 NOTE — Clinical Social Work Note (Signed)
Patient's discharge to [facility] has been cancelled and Rn CM is trying to speak with cardiology to see if the event monitor can be discontinued because it is causing patient to be frustrated. CSW has contacted [facility] and spoken to [place] to notify her since Otilio Saber has left for the day. CSW has also spoken to patient's son to notify him and he has stated that the event monitor also caused patient frustration yesterday as well and he was disappointed that patient was going to remain in the hospital another evening. Patient's son expressed that if the cardiologist was okay with discontinuing the cardiac monitor, then he would be fine with it being discontinued. Shela Leff MSW,LCSW [phone]

## 2015-06-25 NOTE — Progress Notes (Signed)
Patient kept pulling off tele monitor and 30 day event monitor. While trying to reapply monitor patient became very agitated and combative. Administered Haldol 2mg  IV. Patient is calm and laying in bed. Re-applied tele monitor. Verified with CCMD. 30 day event monitor has been d/c'd. Will continue to monitor patient Cody Gordon

## 2015-06-25 NOTE — Discharge Instructions (Signed)
Heart healthy diet. Activity as tolerated. Fall and aspiration precaution. HH F/u cardiologist for Holter monitor for 30 days.

## 2015-06-25 NOTE — Progress Notes (Signed)
Patient refused bladder scan 

## 2015-06-25 NOTE — Discharge Summary (Signed)
[place] [facility] [place] NAME: Cody Gordon    MR#:  [id number]  DATE OF BIRTH:  08/08/1920  DATE OF ADMISSION:  06/22/2015 ADMITTING PHYSICIAN: Saundra Shelling, MD  DATE OF DISCHARGE: 06/25/2015 PRIMARY CARE PHYSICIAN: No primary care provider on file.    ADMISSION DIAGNOSIS:  fall   DISCHARGE DIAGNOSIS:  Accidental fall Bradycardia SECONDARY DIAGNOSIS:   Past Medical History  Diagnosis Date  . Neoplasm, bladder   . Prostate cancer ([place])   . Sinus bradycardia     bradycardia  . Nephrolithiasis   . Incomplete emptying of bladder   . Hematuria   . Dementia   . Anemia   . DVT (deep venous thrombosis) (HCC)     PE also  . 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 Diverticulosis of intestine with bleeding     HOSPITAL COURSE:   82 year old male patient with history of dementia, prostate cancer, nephrolithiasis, anemia presented to the emergency room with fall. Evaluation in the emergency room showed bradycardia. Admitting diagnosis  1. Accidental fall Fall precaution.  2. Syncope -No obvious source found other than intermittent bradycardia  3. Bradycardia -Patient's heart rate was anywhere from 50-63. He does not let telemetry monitor stay on him pulling it frequently. Telemetry monitor removed. Appreciated cardiology input. Discontinued Aricept. -Patient will need to set up outpatient Holter monitoring for 30 day this will be set up by [facility].  4. Dementia Fall and aspiration precaution.  DISCHARGE CONDITIONS:   Stable, discharge to ALF today.  CONSULTS OBTAINED:  Treatment Team:  Deboraha Sprang, MD  DRUG ALLERGIES:  No Known Allergies  DISCHARGE MEDICATIONS:   Current Discharge Medication List    CONTINUE these medications which have NOT CHANGED   Details  acetaminophen (TYLENOL) 325 MG tablet Take 650 mg by mouth every 4 (four) hours as needed for mild pain, fever or headache.      bisacodyl (BISACODYL) 5 MG EC tablet Take 10 mg by mouth daily as needed for moderate constipation.    mirtazapine (REMERON) 15 MG tablet Take 15 mg by mouth at bedtime.    neomycin-bacitracin-polymyxin (NEOSPORIN) [phone] ointment Apply 1 application topically daily. Pt applies to right upper arm.    omeprazole (PRILOSEC) 20 MG capsule Take 20 mg by mouth daily.    potassium chloride (K-DUR,KLOR-CON) 10 MEQ tablet Take 10 mEq by mouth daily.    QUEtiapine (SEROQUEL) 50 MG tablet Take 50 mg by mouth 2 (two) times daily.    Vitamin D, Ergocalciferol, (DRISDOL) 50000 units CAPS capsule Take 50,000 Units by mouth once a week. Pt takes on Tuesday.      STOP taking these medications     donepezil (ARICEPT) 5 MG tablet      cephALEXin (KEFLEX) 500 MG capsule          DISCHARGE INSTRUCTIONS:    If you experience worsening of your admission symptoms, develop shortness of breath, life threatening emergency, suicidal or homicidal thoughts you must seek medical attention immediately by calling 911 or calling your MD immediately  if symptoms less severe.  You Must read complete instructions/literature along with all the possible adverse reactions/side effects for all the Medicines you take and that have been prescribed to you. Take any new Medicines after you have completely understood and accept all the possible adverse reactions/side effects.   Please note  You were cared for by a hospitalist during your hospital stay. If you  have any questions about your discharge medications or the care you received while you were in the hospital after you are discharged, you can call the unit and asked to speak with the hospitalist on call if the hospitalist that took care of you is not available. Once you are discharged, your primary care physician will handle any further medical issues. Please note that NO REFILLS for any discharge medications will be authorized once you are discharged, as it is  imperative that you return to your primary care physician (or establish a relationship with a primary care physician if you do not have one) for your aftercare needs so that they can reassess your need for medications and monitor your lab values.    Today   SUBJECTIVE   Demented and non-communicative.   VITAL SIGNS:  Blood pressure 132/68, pulse 38, temperature 98.2 F (36.8 C), temperature source Oral, resp. rate 18, height 5\' 11"  (1.803 m), weight 70.67 kg (155 lb 12.8 oz), SpO2 96 %.  I/O:   Intake/Output Summary (Last 24 hours) at 06/25/15 1004 Last data filed at 06/25/15 0932  Gross per 24 hour  Intake    240 ml  Output    350 ml  Net   -110 ml    PHYSICAL EXAMINATION:  GENERAL:  82 y.o.-year-old patient lying in the bed with no acute distress.  EYES: Pupils equal, round, reactive to light and accommodation. No scleral icterus. Extraocular muscles intact.  HEENT: Head atraumatic, normocephalic. Oropharynx and nasopharynx clear.  NECK:  Supple, no jugular venous distention. No thyroid enlargement, no tenderness.  LUNGS: Normal breath sounds bilaterally, no wheezing, rales,rhonchi or crepitation. No use of accessory muscles of respiration.  CARDIOVASCULAR: S1, S2 normal. No murmurs, rubs, or gallops.  ABDOMEN: Soft, non-tender, non-distended. Bowel sounds present. No organomegaly or mass.  EXTREMITIES: No pedal edema, cyanosis, or clubbing.  NEUROLOGIC: unable to exam. PSYCHIATRIC: The patient is demented. SKIN: No obvious rash, lesion, or ulcer.   DATA REVIEW:   CBC  Recent Labs Lab 06/23/15 0127  WBC 6.0  HGB 11.7*  HCT 35.1*  PLT 181    Chemistries   Recent Labs Lab 06/23/15 0127  NA 143  K 3.7  CL 109  CO2 26  GLUCOSE 109*  BUN 14  CREATININE 1.05  CALCIUM 9.3  MG 1.4*  AST 23  ALT 12*  ALKPHOS 81  BILITOT 0.5    Cardiac Enzymes  Recent Labs Lab 06/23/15 1625  TROPONINI 0.03    Microbiology Results  Results for orders placed or  performed during the hospital encounter of 06/22/15  MRSA PCR Screening     Status: None   Collection Time: 06/24/15  5:31 AM  Result Value Ref Range Status   MRSA by PCR NEGATIVE NEGATIVE Final    Comment:        The GeneXpert MRSA Assay (FDA approved for NASAL specimens only), is one component of a comprehensive MRSA colonization surveillance program. It is not intended to diagnose MRSA infection nor to guide or monitor treatment for MRSA infections.     RADIOLOGY:  No results found.      Management plans discussed with the patient, family and they are in agreement.  CODE STATUS:     Code Status Orders        Start     Ordered   06/23/15 0445  Do not attempt resuscitation (DNR)   Continuous    Question Answer Comment  In the event of cardiac or respiratory  ARREST Do not call a "code blue"   In the event of cardiac or respiratory ARREST Do not perform Intubation, CPR, defibrillation or ACLS   In the event of cardiac or respiratory ARREST Use medication by any route, position, wound care, and other measures to relive pain and suffering. May use oxygen, suction and manual treatment of airway obstruction as needed for comfort.      06/23/15 0444    Code Status History    Date Active Date Inactive Code Status Order ID Comments User Context   This patient has a current code status but no historical code status.    Advance Directive Documentation        Most Recent Value   Type of Advance Directive  Out of facility DNR (pink MOST or yellow form)   Pre-existing out of facility DNR order (yellow form or pink MOST form)     "MOST" Form in Place?        TOTAL TIME TAKING CARE OF THIS PATIENT:35 minutes.    Demetrios Loll M.D on 06/25/2015 at 10:04 AM  Between 7am to 6pm - Pager - [phone]  After 6pm go to [email or web address] - password [email or web address] [facility]  [place] [facility]  Office  [phone]  CC: Primary care physician; No primary care provider on  file.

## 2015-06-25 NOTE — Telephone Encounter (Signed)
TCM...hosptial called stating pt was seen by Dr Caryl Comes for Bradycardia  Pt is coming 07/05/15 to see Dr Caryl Comes

## 2015-06-25 NOTE — Consult Note (Signed)
Patient has become combative this afternoon and is requiring IV ativan.  Patient is constantly pulling and picking at the 30 day event monitor that has been placed.  Discussed efficacy in patient that has advanced dementia, and is DNR.  Attending asks that cariology be contacted.  Have paged Dr Virl Axe- the cardiologist that recommended the monitor.  spoke with [facility] office and office will have MD or nurse contact CM and or the primary nurse. CSW spoke with patient's son and he stated if cardiology feels this is not going to be of benefit, he is ok with discontinuing.  Due to behavior issues, discharge will be cancelled.

## 2015-06-25 NOTE — Telephone Encounter (Signed)
Nann Green sw on 2a wants to speak with provider re efficacy of holter monitor.  [place] notified to call .

## 2015-06-25 NOTE — Progress Notes (Signed)
Went to patients room and monitor was not on patient. Patients nurse Donnisha RN stated that he became confused and was pulling the monitor off. Monitor was discontinued per verbal orders by Bernerd Pho PA and returned to preventice.

## 2015-06-25 NOTE — Progress Notes (Signed)
Placed 30 day event monitor on patient and reviewed instructions with patient and his nurse [facility] RN. Showed them booklet with instructions if they have further questions and also phone numbers in case they have any further questions. Patient is oriented only to self at this time. They both verbalized understanding and monitor was then placed on patient at bedside.

## 2015-06-26 DIAGNOSIS — R001 Bradycardia, unspecified: Secondary | ICD-10-CM | POA: Diagnosis not present

## 2015-06-26 DIAGNOSIS — R2681 Unsteadiness on feet: Secondary | ICD-10-CM | POA: Insufficient documentation

## 2015-06-26 DIAGNOSIS — W19XXXA Unspecified fall, initial encounter: Secondary | ICD-10-CM | POA: Diagnosis not present

## 2015-06-26 DIAGNOSIS — F0391 Unspecified dementia with behavioral disturbance: Secondary | ICD-10-CM

## 2015-06-26 DIAGNOSIS — L8990 Pressure ulcer of unspecified site, unspecified stage: Secondary | ICD-10-CM

## 2015-06-26 DIAGNOSIS — F0390 Unspecified dementia without behavioral disturbance: Secondary | ICD-10-CM | POA: Insufficient documentation

## 2015-06-26 NOTE — Progress Notes (Signed)
Pt refusing all medications and procedures.

## 2015-06-26 NOTE — Progress Notes (Signed)
EMS called for non emergent transport to [facility].

## 2015-06-26 NOTE — Telephone Encounter (Signed)
Patient currently admitted. ? If the patient needs to have a 30 day event placed as outpatient per Dr. Olin Pia Consult note.

## 2015-06-26 NOTE — Progress Notes (Signed)
Patient: Cody Gordon. / Admit Date: 06/22/2015 / Date of Encounter: 06/26/2015, 9:29 AM   Subjective: Agitated overnight. Unable to apply 30-day event monitor given patient's agitation. He spends most of his time in the bed and recliner. When he does ambulate he will use a walker. Blood pressure in the 120's/upper 60's.   Review of Systems: Review of Systems  Constitutional: Positive for weight loss and malaise/fatigue. Negative for fever, chills and diaphoresis.  HENT: Negative for congestion.   Eyes: Negative for discharge.  Respiratory: Negative for cough, hemoptysis, sputum production, shortness of breath and wheezing.   Cardiovascular: Negative for chest pain, palpitations, orthopnea, claudication, leg swelling and PND.  Gastrointestinal: Negative for nausea and vomiting.  Musculoskeletal: Positive for falls. Negative for myalgias and back pain.  Skin: Negative for rash.  Neurological: Positive for weakness. Negative for sensory change, speech change, focal weakness and loss of consciousness.  Endo/Heme/Allergies: Does not bruise/bleed easily.  Psychiatric/Behavioral: The patient is not nervous/anxious.   All other systems reviewed and are negative.   Objective: Telemetry: sinus bradycardia, upper 30's to mid 50's bpm (sleeping) Physical Exam: Blood pressure 120/69, pulse 48, temperature 97.4 F (36.3 C), temperature source Oral, resp. rate 17, height 5\' 11"  (1.803 m), weight 155 lb 12.8 oz (70.67 kg), SpO2 95 %. Body mass index is 21.74 kg/(m^2). General: Well developed, well nourished, in no acute distress. Head: Normocephalic, atraumatic, sclera non-icteric, no xanthomas, nares are without discharge. Neck: Negative for carotid bruits. JVP not elevated. Lungs: Clear bilaterally to auscultation without wheezes, rales, or rhonchi. Breathing is unlabored. Heart: Bradycardic, S1 S2 without murmurs, rubs, or gallops.  Abdomen: Soft, non-tender, non-distended with  normoactive bowel sounds. No rebound/guarding. Extremities: No clubbing or cyanosis. No edema. Distal pedal pulses are 2+ and equal bilaterally. Neuro: Alert. Moves all extremities spontaneously. Psych:  Responds to questions appropriately with a normal affect.   Intake/Output Summary (Last 24 hours) at 06/26/15 0929 Last data filed at 06/25/15 2228  Gross per 24 hour  Intake      0 ml  Output    929 ml  Net   -929 ml    Inpatient Medications:  . aspirin  81 mg Oral Daily  . enoxaparin (LOVENOX) injection  40 mg Subcutaneous Daily  . mirtazapine  15 mg Oral QHS  . pantoprazole  40 mg Oral QAC breakfast  . QUEtiapine  50 mg Oral BID  . sodium chloride flush  3 mL Intravenous Q12H  . TRIPLE ANTIBIOTIC  1 application Topical Daily  . Vitamin D (Ergocalciferol)  50,000 Units Oral Weekly   Infusions:  . sodium chloride 75 mL/hr at 06/23/15 0445    Labs:  Recent Labs  06/25/15 1401  MG 3.2*   No results for input(s): AST, ALT, ALKPHOS, BILITOT, PROT, ALBUMIN in the last 72 hours. No results for input(s): WBC, NEUTROABS, HGB, HCT, MCV, PLT in the last 72 hours.  Recent Labs  06/23/15 1045 06/23/15 1625  TROPONINI 0.03 0.03   Invalid input(s): POCBNP No results for input(s): HGBA1C in the last 72 hours.   Weights: Filed Weights   06/22/15 2348 06/23/15 0438  Weight: 169 lb 1.6 oz (76.703 kg) 155 lb 12.8 oz (70.67 kg)     Radiology/Studies:  Ct Head Wo Contrast  06/23/2015  CLINICAL DATA:  Status post unwitnessed fall, with concern for head injury. Initial encounter. EXAM: CT HEAD WITHOUT CONTRAST TECHNIQUE: Contiguous axial images were obtained from the base of the skull through the  vertex without intravenous contrast. COMPARISON:  CT of the head performed 03/19/2015 FINDINGS: There is no evidence of acute infarction, mass lesion, or intra- or extra-axial hemorrhage on CT. Prominence of the ventricles and sulci reflects moderately severe cortical volume loss.  Cerebellar atrophy is noted. Scattered periventricular and subcortical white matter change likely reflects small vessel ischemic microangiopathy. The brainstem and fourth ventricle are within normal limits. The basal ganglia are unremarkable in appearance. The cerebral hemispheres demonstrate grossly normal gray-white differentiation. No mass effect or midline shift is seen. There is no evidence of fracture; visualized osseous structures are unremarkable in appearance. The orbits are within normal limits. The paranasal sinuses and mastoid air cells are well-aerated. No significant soft tissue abnormalities are seen. IMPRESSION: 1. No acute intracranial pathology seen on CT. 2. Moderately severe cortical volume loss and scattered small vessel ischemic microangiopathy. Electronically Signed   By: Garald Balding M.D.   On: 06/23/2015 01:41     Assessment and Plan   1. Sick sinus syndrome: -Patient with stable vital signs throughout admission  -He spends most of his time in the bed/recliner. When he does ambulate he uses a walker -Unable to wear 30-day event monitor given agitation  -Given his stable vital signs continue to monitor -Aricept has been held, half-life of 70 hours, thus this will be in his system for 2 weeks, possibly leading to his bradycardia  2. Fall: -Check orthostatic vital signs and treat as needed -Ambulate with walker  3. Dementia: -Aricept held -Per IM  Signed, Christell Faith, PA-C Pager: [phone] 06/26/2015, 9:29 AM    Attending Note Patient seen and examined, agree with detailed note above,  Patient presentation and plan discussed on rounds.   Patient relatively nonverbal after receiving Haldol last night He is awake and alert in bed Telemetry reviewed showing bradycardia , systolic pressures typically in the 120 range Orthostatics performed showing actually an increase in his pressures with standing, asymptomatic  Unable to tolerate 30 day monitor Given he  is asymptomatic, would monitor for now He is high risk of fall given gait instability Would recommend walker, may need constant surveillance Currently does not seem to need pacemaker for hemodynamic support  Greater than 50% was spent in counseling and coordination of care with patient Total encounter time 25 minutes or more   Signed: Esmond Plants  M.D., Ph.D. [address] [facility]

## 2015-06-26 NOTE — Plan of Care (Signed)
Problem: Safety: Goal: Ability to remain free from injury will improve Outcome: Not Progressing Pt being impulsive at times, attempting to get out of bed.

## 2015-06-26 NOTE — Progress Notes (Signed)
Assisted up to bathroom, sat on toilet and voided.  Back to bed.

## 2015-06-26 NOTE — Progress Notes (Signed)
Report called to Cody Gordon at [facility].

## 2015-06-26 NOTE — Discharge Summary (Signed)
[facility] [place] NAME: Cody Gordon    MR#:  [id number]  DATE OF BIRTH:  08-Apr-1920  DATE OF ADMISSION:  06/22/2015 ADMITTING PHYSICIAN: Saundra Shelling, MD  DATE OF DISCHARGE: 06/26/2015 PRIMARY CARE PHYSICIAN: No primary care provider on file.    ADMISSION DIAGNOSIS:  fall   DISCHARGE DIAGNOSIS:  Accidental fall Bradycardia SECONDARY DIAGNOSIS:   Past Medical History  Diagnosis Date  . Neoplasm, bladder   . Prostate cancer ([place])   . Sinus bradycardia   . Nephrolithiasis   . Incomplete emptying of bladder   . Hematuria   . Dementia   . Anemia   . DVT (deep venous thrombosis) ([facility])     PE also  . 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 Diverticulosis of intestine with bleeding     HOSPITAL COURSE:   82 year old male patient with history of dementia, prostate cancer, nephrolithiasis, anemia presented to the emergency room with fall. Evaluation in the emergency room showed bradycardia. Admitting diagnosis  1. Accidental fall Fall precaution.  2. Syncope -No obvious source found other than intermittent bradycardia  3. Bradycardia He did not let telemetry monitor stay on him pulling it frequently. Telemetry monitor removed and put back. Aricept has been held, half-life of 70 hours, thus this will be in his system for 2 weeks, possibly leading to his bradycardia per Dr. Rockey Situ. -Patient needs to set up outpatient Holter monitoring for 30 day, but unable to wear 30-day event monitor given agitation.  4. Dementia Fall and aspiration precaution.  DISCHARGE CONDITIONS:   Stable, discharge to ALF today.  CONSULTS OBTAINED:  Treatment Team:  Deboraha Sprang, MD  DRUG ALLERGIES:  No Known Allergies  DISCHARGE MEDICATIONS:   Current Discharge Medication List    CONTINUE these medications which have NOT CHANGED   Details  acetaminophen (TYLENOL) 325 MG tablet Take 650 mg by mouth every 4 (four)  hours as needed for mild pain, fever or headache.     bisacodyl (BISACODYL) 5 MG EC tablet Take 10 mg by mouth daily as needed for moderate constipation.    mirtazapine (REMERON) 15 MG tablet Take 15 mg by mouth at bedtime.    neomycin-bacitracin-polymyxin (NEOSPORIN) [phone] ointment Apply 1 application topically daily. Pt applies to right upper arm.    omeprazole (PRILOSEC) 20 MG capsule Take 20 mg by mouth daily.    potassium chloride (K-DUR,KLOR-CON) 10 MEQ tablet Take 10 mEq by mouth daily.    QUEtiapine (SEROQUEL) 50 MG tablet Take 50 mg by mouth 2 (two) times daily.    Vitamin D, Ergocalciferol, (DRISDOL) 50000 units CAPS capsule Take 50,000 Units by mouth once a week. Pt takes on Tuesday.      STOP taking these medications     donepezil (ARICEPT) 5 MG tablet      cephALEXin (KEFLEX) 500 MG capsule          DISCHARGE INSTRUCTIONS:    If you experience worsening of your admission symptoms, develop shortness of breath, life threatening emergency, suicidal or homicidal thoughts you must seek medical attention immediately by calling 911 or calling your MD immediately  if symptoms less severe.  You Must read complete instructions/literature along with all the possible adverse reactions/side effects for all the Medicines you take and that have been prescribed to you. Take any new Medicines after you have completely understood and accept all the possible adverse reactions/side effects.   Please note  You  were cared for by a hospitalist during your hospital stay. If you have any questions about your discharge medications or the care you received while you were in the hospital after you are discharged, you can call the unit and asked to speak with the hospitalist on call if the hospitalist that took care of you is not available. Once you are discharged, your primary care physician will handle any further medical issues. Please note that NO REFILLS for any discharge medications  will be authorized once you are discharged, as it is imperative that you return to your primary care physician (or establish a relationship with a primary care physician if you do not have one) for your aftercare needs so that they can reassess your need for medications and monitor your lab values.    Today   SUBJECTIVE   Demented and non-communicative.   VITAL SIGNS:  Blood pressure 133/60, pulse 52, temperature 97.4 F (36.3 C), temperature source Oral, resp. rate 20, height 5\' 11"  (1.803 m), weight 70.67 kg (155 lb 12.8 oz), SpO2 95 %.  I/O:    Intake/Output Summary (Last 24 hours) at 06/26/15 1214 Last data filed at 06/26/15 1033  Gross per 24 hour  Intake      3 ml  Output    929 ml  Net   -926 ml    PHYSICAL EXAMINATION:  GENERAL:  82 y.o.-year-old patient lying in the bed with no acute distress.  EYES: Pupils equal, round, reactive to light and accommodation. No scleral icterus. Extraocular muscles intact.  HEENT: Head atraumatic, normocephalic. Oropharynx and nasopharynx clear.  NECK:  Supple, no jugular venous distention. No thyroid enlargement, no tenderness.  LUNGS: Normal breath sounds bilaterally, no wheezing, rales,rhonchi or crepitation. No use of accessory muscles of respiration.  CARDIOVASCULAR: S1, S2 normal. No murmurs, rubs, or gallops.  ABDOMEN: Soft, non-tender, non-distended. Bowel sounds present. No organomegaly or mass.  EXTREMITIES: No pedal edema, cyanosis, or clubbing.  NEUROLOGIC: unable to exam. PSYCHIATRIC: The patient is demented. SKIN: No obvious rash, lesion, or ulcer.   DATA REVIEW:   CBC  Recent Labs Lab 06/23/15 0127  WBC 6.0  HGB 11.7*  HCT 35.1*  PLT 181    Chemistries   Recent Labs Lab 06/23/15 0127 06/25/15 1401  NA 143  --   K 3.7  --   CL 109  --   CO2 26  --   GLUCOSE 109*  --   BUN 14  --   CREATININE 1.05  --   CALCIUM 9.3  --   MG 1.4* 3.2*  AST 23  --   ALT 12*  --   ALKPHOS 81  --   BILITOT 0.5  --      Cardiac Enzymes  Recent Labs Lab 06/23/15 1625  TROPONINI 0.03    Microbiology Results  Results for orders placed or performed during the hospital encounter of 06/22/15  MRSA PCR Screening     Status: None   Collection Time: 06/24/15  5:31 AM  Result Value Ref Range Status   MRSA by PCR NEGATIVE NEGATIVE Final    Comment:        The GeneXpert MRSA Assay (FDA approved for NASAL specimens only), is one component of a comprehensive MRSA colonization surveillance program. It is not intended to diagnose MRSA infection nor to guide or monitor treatment for MRSA infections.     RADIOLOGY:  No results found.      Management plans discussed with the patient, family and  they are in agreement.  CODE STATUS:     Code Status Orders        Start     Ordered   06/23/15 0445  Do not attempt resuscitation (DNR)   Continuous    Question Answer Comment  In the event of cardiac or respiratory ARREST Do not call a "code blue"   In the event of cardiac or respiratory ARREST Do not perform Intubation, CPR, defibrillation or ACLS   In the event of cardiac or respiratory ARREST Use medication by any route, position, wound care, and other measures to relive pain and suffering. May use oxygen, suction and manual treatment of airway obstruction as needed for comfort.      06/23/15 0444    Code Status History    Date Active Date Inactive Code Status Order ID Comments User Context   This patient has a current code status but no historical code status.    Advance Directive Documentation        Most Recent Value   Type of Advance Directive  Out of facility DNR (pink MOST or yellow form)   Pre-existing out of facility DNR order (yellow form or pink MOST form)     "MOST" Form in Place?        TOTAL TIME TAKING CARE OF THIS PATIENT:35 minutes.    Demetrios Loll M.D on 06/26/2015 at 12:14 PM  Between 7am to 6pm - Pager - [phone]  After 6pm go to [email or web address] - password [email or web address]  [facility]  [facility]  Office  [phone]  CC: Primary care physician; No primary care provider on file.

## 2015-06-26 NOTE — Telephone Encounter (Signed)
This was sent in a staff message from [place].  Valora Corporal, RN  P Cv Div [facility] St Pre Cert/Auth Cc: Emily Filbert, RN           Caryl Pina, This monitor was removed. Patient has dementia and kept pulling it off so they discontinued it. I called [employer] and notified them that he was not able to wear it.   Heather,  They ordered a event monitor but due to dementia he would not leave it on so they discontinued it. Patient is a DNR and going to a facility once discharged. Please let me know if you have any questions.   Thanks,  Olin Hauser

## 2015-06-26 NOTE — Progress Notes (Signed)
EMS here to transport pt back to [facility].

## 2015-06-26 NOTE — Care Management (Signed)
Informed that patient has not voided today and patient requiring I/O cath since admission.  Spoke with lavern at [facility] and informed that had not been any concerns regarding ability to void at the facility. Voiding attempts and results are not closely monitored at assisted level of care. Spoke with attending and it is felt voiding issues are no acute. Indwelling foley would most likely agitate patient and he would pull it out. Spoke with primary nurse and discussed actually toileting the patient.  When she took the patient to the bathroom, he emptied his bladder without problems.  Left message for Lavern at facility around 12:30p to call CM to discuss.  Notified Tim Koleen Nimrod with [facility] of need to follow gu status and voiding.  Updated CSW

## 2015-06-26 NOTE — Clinical Social Work Note (Signed)
MD is going to proceed with discharge this morning. CSW has contacted [facility] and Otilio Saber was not in yet so CSW spoke with [facility] who is aware of patient's return. CSW has left message for patient's nephew on both cell and home numbers however, patient's nephew was aware from [place] call late yesterday that patient would more than likely discharge this morning. Shela Leff MSW,LCSW [phone]

## 2015-07-05 ENCOUNTER — Encounter: Payer: Medicare Other | Admitting: Internal Medicine

## 2015-07-06 ENCOUNTER — Encounter: Payer: Self-pay | Admitting: *Deleted

## 2015-08-21 ENCOUNTER — Emergency Department: Payer: Medicare Other

## 2015-08-21 ENCOUNTER — Emergency Department
Admission: EM | Admit: 2015-08-21 | Discharge: 2015-08-21 | Disposition: A | Discharge status: Home / Self Care | Payer: Medicare Other | Attending: Emergency Medicine | Admitting: Emergency Medicine

## 2015-08-21 ENCOUNTER — Other Ambulatory Visit: Payer: Self-pay

## 2015-08-21 DIAGNOSIS — Y9389 Activity, other specified: Secondary | ICD-10-CM | POA: Insufficient documentation

## 2015-08-21 DIAGNOSIS — S50311A Abrasion of right elbow, initial encounter: Secondary | ICD-10-CM | POA: Diagnosis not present

## 2015-08-21 DIAGNOSIS — Y929 Unspecified place or not applicable: Secondary | ICD-10-CM | POA: Insufficient documentation

## 2015-08-21 DIAGNOSIS — Z79899 Other long term (current) drug therapy: Secondary | ICD-10-CM | POA: Insufficient documentation

## 2015-08-21 DIAGNOSIS — S0101XA Laceration without foreign body of scalp, initial encounter: Secondary | ICD-10-CM | POA: Diagnosis not present

## 2015-08-21 DIAGNOSIS — Z8546 Personal history of malignant neoplasm of prostate: Secondary | ICD-10-CM | POA: Diagnosis not present

## 2015-08-21 DIAGNOSIS — Y999 Unspecified external cause status: Secondary | ICD-10-CM | POA: Insufficient documentation

## 2015-08-21 DIAGNOSIS — F0390 Unspecified dementia without behavioral disturbance: Secondary | ICD-10-CM

## 2015-08-21 DIAGNOSIS — W1800XA Striking against unspecified object with subsequent fall, initial encounter: Secondary | ICD-10-CM | POA: Diagnosis not present

## 2015-08-21 DIAGNOSIS — R001 Bradycardia, unspecified: Secondary | ICD-10-CM | POA: Diagnosis not present

## 2015-08-21 DIAGNOSIS — S0990XA Unspecified injury of head, initial encounter: Secondary | ICD-10-CM | POA: Diagnosis present

## 2015-08-21 DIAGNOSIS — Z86718 Personal history of other venous thrombosis and embolism: Secondary | ICD-10-CM | POA: Insufficient documentation

## 2015-08-21 DIAGNOSIS — Z791 Long term (current) use of non-steroidal anti-inflammatories (NSAID): Secondary | ICD-10-CM | POA: Insufficient documentation

## 2015-08-21 DIAGNOSIS — W19XXXA Unspecified fall, initial encounter: Secondary | ICD-10-CM

## 2015-08-21 LAB — CBC WITH DIFFERENTIAL/PLATELET
BASOS ABS: 0 10*3/uL (ref 0–0.1)
BASOS PCT: 1 %
Eosinophils Absolute: 0.4 10*3/uL (ref 0–0.7)
Eosinophils Relative: 8 %
HEMATOCRIT: 36.1 % — AB (ref 40.0–52.0)
HEMOGLOBIN: 12 g/dL — AB (ref 13.0–18.0)
LYMPHS PCT: 24 %
Lymphs Abs: 1.3 10*3/uL (ref 1.0–3.6)
MCH: 26.7 pg (ref 26.0–34.0)
MCHC: 33.1 g/dL (ref 32.0–36.0)
MCV: 80.6 fL (ref 80.0–100.0)
MONO ABS: 0.5 10*3/uL (ref 0.2–1.0)
MONOS PCT: 10 %
NEUTROS ABS: 3.1 10*3/uL (ref 1.4–6.5)
NEUTROS PCT: 57 %
Platelets: 209 10*3/uL (ref 150–440)
RBC: 4.48 MIL/uL (ref 4.40–5.90)
RDW: 17.6 % — ABNORMAL HIGH (ref 11.5–14.5)
WBC: 5.4 10*3/uL (ref 3.8–10.6)

## 2015-08-21 LAB — BASIC METABOLIC PANEL
ANION GAP: 8 (ref 5–15)
BUN: 18 mg/dL (ref 6–20)
CALCIUM: 9.9 mg/dL (ref 8.9–10.3)
CO2: 26 mmol/L (ref 22–32)
Chloride: 108 mmol/L (ref 101–111)
Creatinine, Ser: 1.01 mg/dL (ref 0.61–1.24)
GLUCOSE: 99 mg/dL (ref 65–99)
POTASSIUM: 3.9 mmol/L (ref 3.5–5.1)
Sodium: 142 mmol/L (ref 135–145)

## 2015-08-21 LAB — TROPONIN I: Troponin I: <0.03 ng/mL (ref <0.03)

## 2015-08-21 LAB — PHOSPHORUS: Phosphorus: 2.7 mg/dL (ref 2.5–4.6)

## 2015-08-21 LAB — MAGNESIUM: Magnesium: 1.5 mg/dL — ABNORMAL LOW (ref 1.7–2.4)

## 2015-08-21 MED ORDER — SODIUM CHLORIDE 0.9 % IV BOLUS (SEPSIS)
1000.0000 mL | Freq: Once | INTRAVENOUS | Status: AC
  Administered 2015-08-21: 1000 mL via INTRAVENOUS

## 2015-08-21 MED ORDER — MAGNESIUM SULFATE 2 GM/50ML IV SOLN
2.0000 g | Freq: Once | INTRAVENOUS | Status: AC
  Administered 2015-08-21: 2 g via INTRAVENOUS
  Filled 2015-08-21: qty 50

## 2015-08-21 MED ORDER — TETANUS-DIPHTH-ACELL PERTUSSIS 5-2.5-18.5 LF-MCG/0.5 IM SUSP
0.5000 mL | Freq: Once | INTRAMUSCULAR | Status: AC
  Administered 2015-08-21: 0.5 mL via INTRAMUSCULAR
  Filled 2015-08-21: qty 0.5

## 2015-08-21 NOTE — ED Notes (Signed)
Pt presents to ED via [facility] from [facility] after an unwitnessed fall. Pt has hx dementia. Pt does not remember if he hit his head and lost consciousness or what he fell on or hit head on. Pt has laceration to back of head that is wrapped. Blood noted to wrapping. BP with fire department 170/80, with EMS 115/70. Pt with no headache or pain at this time, just complaining of dizziness.

## 2015-08-21 NOTE — ED Provider Notes (Signed)
[facility] Emergency Department Provider Note  ____________________________________________  Time seen: 5:20am  I have reviewed the triage vital signs and the nursing notes.   HISTORY  Chief Complaint Fall and Head Laceration Level 5 caveat:  Portions of the history and physical were unable to be obtained due to the patient's chronic dementia    HPI Cody Gordon. is a 82 y.o. male comes to the ED due to a fall with head injury. Patient states she was trying to help his wife, when he fell. He denies any preceding chest pain shortness of breath and dizziness. After the fall he complains of dizziness. He also reports some pain in the back of the head where he landed and hit his head. He does have bleeding in that area. Patient is unable to provide any additional history.  Review of electronic medical record shows that approximately 6 weeks ago the patient was seen in the emergency department for similar circumstances with a fall where he is found to be bradycardic. He was admitted for monitoring and evaluated by cardiology. Because his blood pressures remained stable throughout his hospitalization and because the patient would not tolerate Holter monitoring, no further interventions or workup for planned at that time by cardiology, and he is given a presumptive diagnosis of sick sinus syndrome.     Past Medical History  Diagnosis Date  . Neoplasm, bladder   . Prostate cancer ([place])   . Sinus bradycardia   . Nephrolithiasis   . Incomplete emptying of bladder   . Hematuria   . Dementia   . Anemia   . DVT (deep venous thrombosis) ([facility])     PE also  . 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 Diverticulosis of intestine with bleeding      Patient Active Problem List   Diagnosis Date Noted  . Dementia   . Unsteady gait   . Fall 06/23/2015  . Bradycardia 06/23/2015  . Pressure ulcer 06/23/2015  . CARDIAC ARRHYTHMIA 11/14/2009  .  DIZZINESS 11/14/2009  . SHORTNESS OF BREATH 11/14/2009     Past Surgical History  Procedure Laterality Date  . Back surgery       Current Outpatient Rx  Name  Route  Sig  Dispense  Refill  . acetaminophen (TYLENOL) 325 MG tablet   Oral   Take 650 mg by mouth every 4 (four) hours as needed for mild pain, fever or headache.          Marland Kitchen acetaminophen (TYLENOL) 500 MG tablet   Oral   Take 500 mg by mouth every 4 (four) hours as needed.         Marland Kitchen alum & mag hydroxide-simeth (MAALOX PLUS) 400-400-40 MG/5ML suspension   Oral   Take 15 mLs by mouth every 6 (six) hours as needed for indigestion.         . bisacodyl (BISACODYL) 5 MG EC tablet   Oral   Take 10 mg by mouth daily as needed for moderate constipation.         . cefUROXime (CEFTIN) 500 MG tablet   Oral   Take 500 mg by mouth 2 (two) times daily with a meal.         . donepezil (ARICEPT) 5 MG tablet   Oral   Take 5 mg by mouth at bedtime.         Marland Kitchen guaifenesin (ROBITUSSIN) 100 MG/5ML syrup   Oral   Take 10 mLs by  mouth 3 (three) times daily as needed for cough.         . loperamide (IMODIUM A-D) 2 MG tablet   Oral   Take 2 mg by mouth as needed for diarrhea or loose stools.         . magnesium hydroxide (MILK OF MAGNESIA) 400 MG/5ML suspension   Oral   Take 30 mLs by mouth daily as needed for mild constipation.         . mirtazapine (REMERON) 15 MG tablet   Oral   Take 15 mg by mouth at bedtime.         Marland Kitchen neomycin-bacitracin-polymyxin (NEOSPORIN) [id number] ointment   Topical   Apply 1 application topically daily. Pt applies to right upper arm.         Marland Kitchen omeprazole (PRILOSEC) 20 MG capsule   Oral   Take 20 mg by mouth daily.         . potassium chloride (K-DUR,KLOR-CON) 10 MEQ tablet   Oral   Take 10 mEq by mouth daily.         . Probiotic Product (PHILLIPS COLON HEALTH PO)   Oral   Take 1 capsule by mouth 2 (two) times daily.         . QUEtiapine (SEROQUEL) 50 MG  tablet   Oral   Take 50 mg by mouth 2 (two) times daily.         . Vitamin D, Ergocalciferol, (DRISDOL) 50000 units CAPS capsule   Oral   Take 50,000 Units by mouth once a week. Pt takes on Tuesday.            Allergies Review of patient's allergies indicates no known allergies.   Family History  Problem Relation Age of Onset  . Hypertension Father     deceased    Social History Social History  Substance Use Topics  . Smoking status: Never Smoker   . Smokeless tobacco: None     Comment: tobacco use -no  . Alcohol Use: No    Review of Systems  Constitutional:   No fever or chills.  Cardiovascular:   No chest pain. Respiratory:   No dyspnea or cough. Gastrointestinal:   Negative for abdominal pain, vomiting and diarrhea.  Genitourinary:   Negative for dysuria or difficulty urinating. Musculoskeletal:   Negative for focal pain or swelling Neurological:   Negative for headaches. No weakness 10-point ROS otherwise negative.  ____________________________________________   PHYSICAL EXAM:  VITAL SIGNS: ED Triage Vitals  Enc Vitals Group     BP 08/21/15 0513 157/92 mmHg     Pulse Rate 08/21/15 0513 54     Resp 08/21/15 0513 13     Temp 08/21/15 0513 97.8 F (36.6 C)     Temp src --      SpO2 08/21/15 0513 98 %     Weight 08/21/15 0513 166 lb 1.6 oz (75.342 kg)     Height 08/21/15 0513 6' (1.829 m)     Head Cir --      Peak Flow --      Pain Score 08/21/15 0513 0     Pain Loc --      Pain Edu? --      Excl. in [place]? --     Vital signs reviewed, nursing assessments reviewed.   Constitutional:   Alert and orientedTo self. Well appearing and in no distress. Eyes:   No scleral icterus. No conjunctival pallor. PERRL. EOMI.  No nystagmus. ENT  Head:   Normocephalic with a 5 cm laceration over the occiput, hemostatic.   Nose:   No congestion/rhinnorhea. No septal hematoma   Mouth/Throat:   MMM, no pharyngeal erythema. No peritonsillar mass.     Neck:   No stridor. No SubQ emphysema. No meningismus. Nontender, full range of motion. Hematological/Lymphatic/Immunilogical:   No cervical lymphadenopathy. Cardiovascular:   Bradycardia rate of 40-50. Symmetric bilateral radial and DP pulses.  No murmurs.  Respiratory:   Normal respiratory effort without tachypnea nor retractions. Breath sounds are clear and equal bilaterally. No wheezes/rales/rhonchi. Gastrointestinal:   Soft and nontender. Non distended. There is no CVA tenderness.  No rebound, rigidity, or guarding. Genitourinary:   deferred Musculoskeletal:   Nontender with normal range of motion in all extremities. No joint effusions.  No lower extremity tenderness.  No edema. Neurologic:   Normal speech and language.  CN 2-10 normal. Motor grossly intact. No gross focal neurologic deficits are appreciated.  Skin:    Skin is warm and dry. There is a 1 cm very superficial abrasion over the extensor surface of the right elbow. No bony tenderness. Full range of motion.. No rash noted.  No petechiae, purpura, or bullae.  ____________________________________________    LABS (pertinent positives/negatives) (all labs ordered are listed, but only abnormal results are displayed) Labs Reviewed  CBC WITH DIFFERENTIAL/PLATELET - Abnormal; Notable for the following:    Hemoglobin 12.0 (*)    HCT 36.1 (*)    RDW 17.6 (*)    All other components within normal limits  MAGNESIUM - Abnormal; Notable for the following:    Magnesium 1.5 (*)    All other components within normal limits  BASIC METABOLIC PANEL  TROPONIN I  PHOSPHORUS  URINALYSIS COMPLETEWITH MICROSCOPIC ([facility] ONLY)   ____________________________________________   EKG  Interpreted by me Sinus bradycardia rate of 43, normal axis and intervals. Right bundle-branch block. Normal ST segments and T waves.  ____________________________________________    RADIOLOGY  Chest x-ray unremarkable CT head unremarkable CT cervical  spine unremarkable ____________________________________________   PROCEDURES LACERATION REPAIR Performed by: Joni Fears, Suad Autrey Authorized by: Carrie Mew Consent: Verbal consent obtained. Risks and benefits: risks, benefits and alternatives were discussed Consent given by: patient Patient identity confirmed: provided demographic data Prepped and Draped in normal sterile fashion Wound explored  Laceration Location: Occipital scalp  Laceration Length: 5cm  No Foreign Bodies seen or palpated  Anesthesia: None Irrigation method: syringe Amount of cleaning: standard  Skin closure: Staples   Number of sutures: 5  Technique: Staple gun   Patient tolerance: Patient tolerated the procedure well with no immediate complications.  ____________________________________________   INITIAL IMPRESSION / ASSESSMENT AND PLAN / ED COURSE  Pertinent labs & imaging results that were available during my care of the patient were reviewed by me and considered in my medical decision making (see chart for details).  Patient well appearing no distress. Presents with fall, unclear circumstances, history Limited by dementia. Wound repaired with staples. No evidence of any other significant traumatic injuries. Labs unremarkable except for mild hypomagnesemia which will be repleted IV.  Check CT head and cervical spine T dap Orthostatic vital signs  If unremarkable, since patient has been evaluated for the same circumstances one month prior, he should be suitable for discharge home with follow-up with primary care and cardiology. Cardiology recommendations during last hospitalization were to discontinue donepezil as it may contribute to the bradycardia. Per the current medication administration report, the patient is again receiving donepezil so we will counsel  the patient and caregivers to discontinue this medication as the risk of bradycardia will outweigh any possible benefit is providing  related to dementia.    ----------------------------------------- 7:20 AM on 08/21/2015 -----------------------------------------  CT head and neck unremarkable. Blood pressure stable. We'll discharge home.   ____________________________________________   FINAL CLINICAL IMPRESSION(S) / ED DIAGNOSES  Final diagnoses:  Scalp laceration, initial encounter  Bradycardia  Dementia, without behavioral disturbance  Fall, initial encounter       Portions of this note were generated with dragon dictation software. Dictation errors may occur despite best attempts at proofreading.   Carrie Mew, MD 08/21/15 [phone]

## 2015-08-21 NOTE — ED Notes (Signed)
Pt states he fell because he was helping wife, states "she ended up worse than me." Pt states he lives with his wife. Pt with hx of dementia. Pt unsure what he fell on or up against. Pt knows name, DOB, but does not know what year it is or where he is. Pt denies headache or blurred vision at this time.

## 2015-08-21 NOTE — ED Notes (Signed)
Report called to [facility]. Spoke with Star, [employer] Transportation being arrainged.

## 2015-08-21 NOTE — ED Notes (Signed)
Pt to be discharged. Magnesium has not finished infusing. Will d/c and arrange transport after medication are completed.

## 2015-08-21 NOTE — Discharge Instructions (Signed)
Stop taking donepizil.  This medication might be causing your heart to beat too slowly, which could cause you to feel weak and fall down.  Follow up with your primary care doctor and cardiology for continued monitoring of your symptoms.  Bradycardia Bradycardia is a slower-than-normal heart rate. A normal resting heart rate for an adult ranges from 60 to 100 beats per minute. With bradycardia, the resting heart rate is less than 60 beats per minute. Bradycardia is a problem if your heart cannot pump enough oxygen-rich blood through your body. Bradycardia is not a problem for everyone. For some healthy adults, a slow resting heart rate is normal.  CAUSES  Bradycardia may be caused by:  A problem with the heart's electrical system, such as heart block.  A problem with the heart's natural pacemaker (sinus node).  Heart disease, damage, or infection.  Certain medicines that treat heart conditions.  Certain conditions, such as hypothyroidism and obstructive sleep apnea. RISK FACTORS  Risk factors include:  Being 72 or older.  Having high blood pressure (hypertension), high cholesterol (hyperlipidemia), or diabetes.  Drinking heavily, using tobacco products, or using drugs.  Being stressed. SIGNS AND SYMPTOMS  Signs and symptoms include:  Light-headedness.  Faintingor near fainting.  Fatigue and weakness.  Shortness of breath.  Chest pain (angina).  Drowsiness.  Confusion.  Dizziness. DIAGNOSIS  Diagnosis of bradycardia may include:  A physical exam.  An electrocardiogram (ECG).  Blood tests. TREATMENT  Treatment for bradycardia may include:  Treatment of an underlying condition.  Pacemaker placement. A pacemaker is a small, battery-powered device that is placed under the skin and is programmed to sense your heartbeats. If your heart rate is lower than the programmed rate, the pacemaker will pace your heart.  Changing your medicines or dosages. HOME CARE  INSTRUCTIONS  Take medicines only as directed by your health care provider.  Manage any health conditions that contribute to bradycardia as directed by your health care provider.  Follow a heart-healthy diet. A dietitian can help educate you on healthy food options and changes.  Follow an exercise program approved by your health care provider.  Maintain a healthy weight. Lose weight as approved by your health care provider.  Do not use tobacco products, including cigarettes, chewing tobacco, or electronic cigarettes. If you need help quitting, ask your health care provider.  Do not use illegal drugs.  Limit alcohol intake to no more than 1 drink per day for nonpregnant women and 2 drinks per day for men. One drink equals 12 ounces of beer, 5 ounces of wine, or 1 ounces of hard liquor.  Keep all follow-up visits as directed by your health care provider. This is important. SEEK MEDICAL CARE IF:  You feel light-headed or dizzy.  You almost faint.  You feel weak or are easily fatigued during physical activity.  You experience confusion or have memory problems. SEEK IMMEDIATE MEDICAL CARE IF:   You faint.  You have an irregular heartbeat.  You have chest pain.  You have trouble breathing. MAKE SURE YOU:   Understand these instructions.  Will watch your condition.  Will get help right away if you are not doing well or get worse.   This information is not intended to replace advice given to you by your health care provider. Make sure you discuss any questions you have with your health care provider.   Document Released: 11/02/2001 Document Revised: 03/03/2014 Document Reviewed: 05/18/2013 Elsevier Interactive Patient Education 2016 [place] Injury,  Adult You have a head injury. Headaches and throwing up (vomiting) are common after a head injury. It should be easy to wake up from sleeping. Sometimes you must stay in the hospital. Most problems happen within the  first 24 hours. Side effects may occur up to 7-10 days after the injury.  WHAT ARE THE TYPES OF HEAD INJURIES? Head injuries can be as minor as a bump. Some head injuries can be more severe. More severe head injuries include:  A jarring injury to the brain (concussion).  A bruise of the brain (contusion). This mean there is bleeding in the brain that can cause swelling.  A cracked skull (skull fracture).  Bleeding in the brain that collects, clots, and forms a bump (hematoma). WHEN SHOULD I GET HELP RIGHT AWAY?   You are confused or sleepy.  You cannot be woken up.  You feel sick to your stomach (nauseous) or keep throwing up (vomiting).  Your dizziness or unsteadiness is getting worse.  You have very bad, lasting headaches that are not helped by medicine. Take medicines only as told by your doctor.  You cannot use your arms or legs like normal.  You cannot walk.  You notice changes in the black spots in the center of the colored part of your eye (pupil).  You have clear or bloody fluid coming from your nose or ears.  You have trouble seeing. During the next 24 hours after the injury, you must stay with someone who can watch you. This person should get help right away (call 911 in the [place] if you start to shake and are not able to control it (have seizures), you pass out, or you are unable to wake up. HOW CAN I PREVENT A HEAD INJURY IN THE FUTURE?  Wear seat belts.  Wear a helmet while bike riding and playing sports like football.  Stay away from dangerous activities around the house. WHEN CAN I RETURN TO NORMAL ACTIVITIES AND ATHLETICS? See your doctor before doing these activities. You should not do normal activities or play contact sports until 1 week after the following symptoms have stopped:  Headache that does not go away.  Dizziness.  Poor attention.  Confusion.  Memory problems.  Sickness to your stomach or throwing  up.  Tiredness.  Fussiness.  Bothered by bright lights or loud noises.  Anxiousness or depression.  Restless sleep. MAKE SURE YOU:   Understand these instructions.  Will watch your condition.  Will get help right away if you are not doing well or get worse.   This information is not intended to replace advice given to you by your health care provider. Make sure you discuss any questions you have with your health care provider.   Document Released: 01/24/2008 Document Revised: 03/03/2014 Document Reviewed: 10/18/2012 Elsevier Interactive Patient Education 2016 [place], [place], or Adhesive Wound Closure Health care providers use stitches (sutures), staples, and certain glue (skin adhesives) to hold skin together while it heals (wound closure). You may need this treatment after you have surgery or if you cut your skin accidentally. These methods help your skin to heal more quickly and make it less likely that you will have a scar. A wound may take several months to heal completely. The type of wound you have determines when your wound gets closed. In most cases, the wound is closed as soon as possible (primary skin closure). Sometimes, closure is delayed so the wound can be cleaned and allowed to heal naturally.  This reduces the chance of infection. Delayed closure may be needed if your wound:  Is caused by a bite.  Happened more than 6 hours ago.  Involves loss of skin or the tissues under the skin.  Has dirt or debris in it that cannot be removed.  Is infected. WHAT ARE THE DIFFERENT KINDS OF WOUND CLOSURES? There are many options for wound closure. The one that your health care provider uses depends on how deep and how large your wound is. Adhesive Glue To use this type of glue to close a wound, your health care provider holds the edges of the wound together and paints the glue on the surface of your skin. You may need more than one layer of glue. Then the  wound may be covered with a light bandage (dressing). This type of skin closure may be used for small wounds that are not deep (superficial). Using glue for wound closure is less painful than other methods. It does not require a medicine that numbs the area (local anesthetic). This method also leaves nothing to be removed. Adhesive glue is often used for children and on facial wounds. Adhesive glue cannot be used for wounds that are deep, uneven, or bleeding. It is not used inside of a wound.  Adhesive Strips These strips are made of sticky (adhesive), porous paper. They are applied across your skin edges like a regular adhesive bandage. You leave them on until they fall off. Adhesive strips may be used to close very superficial wounds. They may also be used along with sutures to improve the closure of your skin edges.  Sutures Sutures are the oldest method of wound closure. Sutures can be made from natural substances, such as silk, or from synthetic materials, such as nylon and steel. They can be made from a material that your body can break down as your wound heals (absorbable), or they can be made from a material that needs to be removed from your skin (nonabsorbable). They come in many different strengths and sizes. Your health care provider attaches the sutures to a steel needle on one end. Sutures can be passed through your skin, or through the tissues beneath your skin. Then they are tied and cut. Your skin edges may be closed in one continuous stitch or in separate stitches. Sutures are strong and can be used for all kinds of wounds. Absorbable sutures may be used to close tissues under the skin. The disadvantage of sutures is that they may cause skin reactions that lead to infection. Nonabsorbable sutures need to be removed. Staples When surgical staples are used to close a wound, the edges of your skin on both sides of the wound are brought close together. A staple is placed across the wound,  and an instrument secures the edges together. Staples are often used to close surgical cuts (incisions). Staples are faster to use than sutures, and they cause less skin reaction. Staples need to be removed using a tool that bends the staples away from your skin. HOW DO I CARE FOR MY WOUND CLOSURE?  Take medicines only as directed by your health care provider.  If you were prescribed an antibiotic medicine for your wound, finish it all even if you start to feel better.  Use ointments or creams only as directed by your health care provider.  Wash your hands with soap and water before and after touching your wound.  Do not soak your wound in water. Do not take baths, swim, or use  a hot tub until your health care provider approves.  Ask your health care provider when you can start showering. Cover your wound if directed by your health care provider.  Do not take out your own sutures or staples.  Do not pick at your wound. Picking can cause an infection.  Keep all follow-up visits as directed by your health care provider. This is important. HOW LONG WILL I HAVE MY WOUND CLOSURE?  Leave adhesive glue on your skin until the glue peels away.  Leave adhesive strips on your skin until the strips fall off.  Absorbable sutures will dissolve within several days.  Nonabsorbable sutures and staples must be removed. The location of the wound will determine how long they stay in. This can range from several days to a couple of weeks. WHEN SHOULD I SEEK HELP FOR MY WOUND CLOSURE? Contact your health care provider if:  You have a fever.  You have chills.  You have drainage, redness, swelling, or pain at your wound.  There is a bad smell coming from your wound.  The skin edges of your wound start to separate after your sutures have been removed.  Your wound becomes thick, raised, and darker in color after your sutures come out (scarring).   This information is not intended to replace advice  given to you by your health care provider. Make sure you discuss any questions you have with your health care provider.   Document Released: 11/05/2000 Document Revised: 03/03/2014 Document Reviewed: 07/20/2013 Elsevier Interactive Patient Education [employer].

## 2015-08-27 ENCOUNTER — Ambulatory Visit: Payer: Medicare Other | Admitting: Cardiovascular Disease

## 2015-08-28 ENCOUNTER — Inpatient Hospital Stay
Admission: EM | Admit: 2015-08-28 | Discharge: 2015-08-31 | DRG: 309 | Disposition: A | Discharge status: Hospice | Payer: Medicare Other | Attending: Internal Medicine | Admitting: Internal Medicine

## 2015-08-28 ENCOUNTER — Emergency Department: Payer: Medicare Other

## 2015-08-28 DIAGNOSIS — Z9181 History of falling: Secondary | ICD-10-CM | POA: Diagnosis not present

## 2015-08-28 DIAGNOSIS — I472 Ventricular tachycardia: Secondary | ICD-10-CM | POA: Diagnosis present

## 2015-08-28 DIAGNOSIS — Z79899 Other long term (current) drug therapy: Secondary | ICD-10-CM

## 2015-08-28 DIAGNOSIS — R296 Repeated falls: Secondary | ICD-10-CM | POA: Diagnosis present

## 2015-08-28 DIAGNOSIS — Z515 Encounter for palliative care: Secondary | ICD-10-CM | POA: Insufficient documentation

## 2015-08-28 DIAGNOSIS — R531 Weakness: Secondary | ICD-10-CM | POA: Diagnosis present

## 2015-08-28 DIAGNOSIS — F03C0 Unspecified dementia, severe, without behavioral disturbance, psychotic disturbance, mood disturbance, and anxiety: Secondary | ICD-10-CM | POA: Insufficient documentation

## 2015-08-28 DIAGNOSIS — Z8249 Family history of ischemic heart disease and other diseases of the circulatory system: Secondary | ICD-10-CM | POA: Diagnosis not present

## 2015-08-28 DIAGNOSIS — F0391 Unspecified dementia with behavioral disturbance: Secondary | ICD-10-CM | POA: Diagnosis present

## 2015-08-28 DIAGNOSIS — Z8719 Personal history of other diseases of the digestive system: Secondary | ICD-10-CM | POA: Diagnosis not present

## 2015-08-28 DIAGNOSIS — Z66 Do not resuscitate: Secondary | ICD-10-CM | POA: Diagnosis not present

## 2015-08-28 DIAGNOSIS — F0390 Unspecified dementia without behavioral disturbance: Secondary | ICD-10-CM | POA: Diagnosis not present

## 2015-08-28 DIAGNOSIS — W19XXXA Unspecified fall, initial encounter: Secondary | ICD-10-CM

## 2015-08-28 DIAGNOSIS — S0990XA Unspecified injury of head, initial encounter: Secondary | ICD-10-CM | POA: Diagnosis present

## 2015-08-28 DIAGNOSIS — Z86718 Personal history of other venous thrombosis and embolism: Secondary | ICD-10-CM | POA: Diagnosis not present

## 2015-08-28 DIAGNOSIS — Z8546 Personal history of malignant neoplasm of prostate: Secondary | ICD-10-CM | POA: Diagnosis not present

## 2015-08-28 DIAGNOSIS — R451 Restlessness and agitation: Secondary | ICD-10-CM | POA: Diagnosis not present

## 2015-08-28 DIAGNOSIS — Y939 Activity, unspecified: Secondary | ICD-10-CM | POA: Diagnosis not present

## 2015-08-28 DIAGNOSIS — Z86711 Personal history of pulmonary embolism: Secondary | ICD-10-CM | POA: Diagnosis not present

## 2015-08-28 DIAGNOSIS — I4510 Unspecified right bundle-branch block: Secondary | ICD-10-CM | POA: Diagnosis present

## 2015-08-28 DIAGNOSIS — I495 Sick sinus syndrome: Principal | ICD-10-CM | POA: Insufficient documentation

## 2015-08-28 DIAGNOSIS — R001 Bradycardia, unspecified: Secondary | ICD-10-CM | POA: Diagnosis present

## 2015-08-28 DIAGNOSIS — Y9289 Other specified places as the place of occurrence of the external cause: Secondary | ICD-10-CM | POA: Diagnosis not present

## 2015-08-28 DIAGNOSIS — S0101XA Laceration without foreign body of scalp, initial encounter: Secondary | ICD-10-CM | POA: Diagnosis not present

## 2015-08-28 DIAGNOSIS — W1830XA Fall on same level, unspecified, initial encounter: Secondary | ICD-10-CM | POA: Diagnosis not present

## 2015-08-28 DIAGNOSIS — Y999 Unspecified external cause status: Secondary | ICD-10-CM | POA: Diagnosis not present

## 2015-08-28 DIAGNOSIS — I4729 Other ventricular tachycardia: Secondary | ICD-10-CM | POA: Insufficient documentation

## 2015-08-28 LAB — CBC
HCT: 34.7 % — ABNORMAL LOW (ref 40.0–52.0)
Hemoglobin: 11.5 g/dL — ABNORMAL LOW (ref 13.0–18.0)
MCH: 26.7 pg (ref 26.0–34.0)
MCHC: 33.1 g/dL (ref 32.0–36.0)
MCV: 80.8 fL (ref 80.0–100.0)
PLATELETS: 198 10*3/uL (ref 150–440)
RBC: 4.3 MIL/uL — AB (ref 4.40–5.90)
RDW: 17.2 % — AB (ref 11.5–14.5)
WBC: 5.5 10*3/uL (ref 3.8–10.6)

## 2015-08-28 LAB — COMPREHENSIVE METABOLIC PANEL
ALK PHOS: 137 U/L — AB (ref 38–126)
ALT: 22 U/L (ref 17–63)
AST: 34 U/L (ref 15–41)
Albumin: 3.7 g/dL (ref 3.5–5.0)
Anion gap: 7 (ref 5–15)
BILIRUBIN TOTAL: 0.7 mg/dL (ref 0.3–1.2)
BUN: 15 mg/dL (ref 6–20)
CALCIUM: 9.4 mg/dL (ref 8.9–10.3)
CHLORIDE: 110 mmol/L (ref 101–111)
CO2: 25 mmol/L (ref 22–32)
CREATININE: 0.9 mg/dL (ref 0.61–1.24)
GFR calc Af Amer: >60 mL/min (ref >60)
Glucose, Bld: 99 mg/dL (ref 65–99)
Potassium: 3.4 mmol/L — ABNORMAL LOW (ref 3.5–5.1)
SODIUM: 142 mmol/L (ref 135–145)
Total Protein: 6.6 g/dL (ref 6.5–8.1)

## 2015-08-28 LAB — URINALYSIS COMPLETE WITH MICROSCOPIC (ARMC ONLY)
BILIRUBIN URINE: NEGATIVE
Glucose, UA: NEGATIVE mg/dL
Ketones, ur: NEGATIVE mg/dL
Nitrite: POSITIVE — AB
PH: 6 (ref 5.0–8.0)
PROTEIN: NEGATIVE mg/dL
Specific Gravity, Urine: 1.014 (ref 1.005–1.030)

## 2015-08-28 LAB — MRSA PCR SCREENING: MRSA BY PCR: NEGATIVE

## 2015-08-28 LAB — TROPONIN I: Troponin I: 0.03 ng/mL (ref <0.03)

## 2015-08-28 MED ORDER — BISACODYL 5 MG PO TBEC
10.0000 mg | DELAYED_RELEASE_TABLET | Freq: Every day | ORAL | Status: DC | PRN
Start: 2015-08-28 — End: 2015-08-31

## 2015-08-28 MED ORDER — QUETIAPINE FUMARATE 25 MG PO TABS
50.0000 mg | ORAL_TABLET | Freq: Two times a day (BID) | ORAL | Status: DC
  Administered 2015-08-28 – 2015-08-30 (×5): 50 mg via ORAL
  Filled 2015-08-28 (×7): qty 2

## 2015-08-28 MED ORDER — POLYETHYLENE GLYCOL 3350 17 G PO PACK: 17.0000 g | PACK | Freq: Every day | ORAL | Status: DC | PRN

## 2015-08-28 MED ORDER — POTASSIUM CHLORIDE CRYS ER 10 MEQ PO TBCR
10.0000 meq | EXTENDED_RELEASE_TABLET | Freq: Every day | ORAL | Status: DC
  Administered 2015-08-28 – 2015-08-30 (×3): 10 meq via ORAL
  Filled 2015-08-28 (×3): qty 1

## 2015-08-28 MED ORDER — ACETAMINOPHEN 650 MG RE SUPP: 650.0000 mg | Freq: Four times a day (QID) | RECTAL | Status: DC | PRN

## 2015-08-28 MED ORDER — PHILLIPS COLON HEALTH PO CAPS: ORAL_CAPSULE | Freq: Two times a day (BID) | ORAL | Status: DC

## 2015-08-28 MED ORDER — ENOXAPARIN SODIUM 40 MG/0.4ML ~~LOC~~ SOLN
40.0000 mg | SUBCUTANEOUS | Status: DC
  Administered 2015-08-28 – 2015-08-31 (×4): 40 mg via SUBCUTANEOUS
  Filled 2015-08-28 (×4): qty 0.4

## 2015-08-28 MED ORDER — ALBUTEROL SULFATE (2.5 MG/3ML) 0.083% IN NEBU: 2.5000 mg | INHALATION_SOLUTION | RESPIRATORY_TRACT | Status: DC | PRN

## 2015-08-28 MED ORDER — ACETAMINOPHEN 325 MG PO TABS: 650.0000 mg | ORAL_TABLET | Freq: Four times a day (QID) | ORAL | Status: DC | PRN

## 2015-08-28 MED ORDER — ONDANSETRON HCL 4 MG PO TABS: 4.0000 mg | ORAL_TABLET | Freq: Four times a day (QID) | ORAL | Status: DC | PRN

## 2015-08-28 MED ORDER — PANTOPRAZOLE SODIUM 40 MG PO TBEC
40.0000 mg | DELAYED_RELEASE_TABLET | Freq: Every day | ORAL | Status: DC
  Administered 2015-08-28 – 2015-08-30 (×3): 40 mg via ORAL
  Filled 2015-08-28 (×3): qty 1

## 2015-08-28 MED ORDER — ONDANSETRON HCL 4 MG/2ML IJ SOLN: 4.0000 mg | Freq: Four times a day (QID) | INTRAMUSCULAR | Status: DC | PRN

## 2015-08-28 MED ORDER — LORAZEPAM 2 MG/ML IJ SOLN
0.5000 mg | Freq: Four times a day (QID) | INTRAMUSCULAR | Status: DC | PRN
  Administered 2015-08-28 – 2015-08-31 (×6): 0.5 mg via INTRAVENOUS
  Filled 2015-08-28 (×6): qty 1

## 2015-08-28 MED ORDER — SODIUM CHLORIDE 0.9% FLUSH
3.0000 mL | Freq: Two times a day (BID) | INTRAVENOUS | Status: DC
  Administered 2015-08-28 – 2015-08-30 (×6): 3 mL via INTRAVENOUS

## 2015-08-28 MED ORDER — HYDROCODONE-ACETAMINOPHEN 5-325 MG PO TABS: 1.0000 | ORAL_TABLET | ORAL | Status: DC | PRN

## 2015-08-28 MED ORDER — MAGNESIUM HYDROXIDE 400 MG/5ML PO SUSP
30.0000 mL | Freq: Every day | ORAL | Status: DC | PRN
Start: 2015-08-28 — End: 2015-08-31

## 2015-08-28 MED ORDER — MIRTAZAPINE 15 MG PO TABS
15.0000 mg | ORAL_TABLET | Freq: Every day | ORAL | Status: DC
  Administered 2015-08-29 – 2015-08-30 (×2): 15 mg via ORAL
  Filled 2015-08-28 (×3): qty 1

## 2015-08-28 MED ORDER — LOPERAMIDE HCL 2 MG PO TABS
2.0000 mg | ORAL_TABLET | ORAL | Status: DC | PRN
  Filled 2015-08-28: qty 1

## 2015-08-28 MED ORDER — ATROPINE SULFATE 0.4 MG/ML IJ SOLN
0.4000 mg | INTRAMUSCULAR | Status: DC | PRN
  Administered 2015-08-29: 0.4 mg via INTRAVENOUS
  Filled 2015-08-28 (×2): qty 1

## 2015-08-28 MED ORDER — DOCUSATE SODIUM 100 MG PO CAPS
100.0000 mg | ORAL_CAPSULE | Freq: Two times a day (BID) | ORAL | Status: DC
  Administered 2015-08-28 – 2015-08-30 (×5): 100 mg via ORAL
  Filled 2015-08-28 (×6): qty 1

## 2015-08-28 NOTE — ED Provider Notes (Signed)
[facility] Emergency Department Provider Note   ____________________________________________  Time seen: Approximately [id number] AM  I have reviewed the triage vital signs and the nursing notes.   HISTORY  Chief Complaint Fall  Patient with a history of dementia so history is limited due to patient's dementia.  HPI Cody Gordon. is a 82 y.o. male who comes into the hospital today from his nursing home. The patient fell but it was unwitnessed. The patient had a fall one week ago as well. The patient denies remembering the fall.He reports he does not know why he is here in the hospital. He denies any pain. He reports that he does have a knot to his head but it is okay if he leaves alone. He denies any chest pain or shortness of breath and he has no other complaints. The [facility].   Past Medical History  Diagnosis Date  . Neoplasm, bladder   . Prostate cancer ([facility])   . Sinus bradycardia   . Nephrolithiasis   . Incomplete emptying of bladder   . Hematuria   . Dementia   . Anemia   . DVT (deep venous thrombosis) ([facility])     PE also  . 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 Diverticulosis of intestine with bleeding     Patient Active Problem List   Diagnosis Date Noted  . Dementia   . Unsteady gait   . Fall 06/23/2015  . Bradycardia 06/23/2015  . Pressure ulcer 06/23/2015  . CARDIAC ARRHYTHMIA 11/14/2009  . DIZZINESS 11/14/2009  . SHORTNESS OF BREATH 11/14/2009    Past Surgical History  Procedure Laterality Date  . Back surgery      Current Outpatient Rx  Name  Route  Sig  Dispense  Refill  . acetaminophen (TYLENOL) 325 MG tablet   Oral   Take 650 mg by mouth every 4 (four) hours as needed for mild pain, fever or headache.          Marland Kitchen acetaminophen (TYLENOL) 500 MG tablet   Oral   Take 500 mg by mouth every 4 (four) hours as needed for mild pain.          Marland Kitchen alum & mag  hydroxide-simeth (MAALOX PLUS) 400-400-40 MG/5ML suspension   Oral   Take 15 mLs by mouth every 6 (six) hours as needed for indigestion.         . bisacodyl (BISACODYL) 5 MG EC tablet   Oral   Take 10 mg by mouth daily as needed for moderate constipation.         Marland Kitchen donepezil (ARICEPT) 5 MG tablet   Oral   Take 5 mg by mouth at bedtime.         Marland Kitchen guaifenesin (ROBITUSSIN) 100 MG/5ML syrup   Oral   Take 10 mLs by mouth 3 (three) times daily as needed for cough.         . loperamide (IMODIUM A-D) 2 MG tablet   Oral   Take 2 mg by mouth as needed for diarrhea or loose stools.         . magnesium hydroxide (MILK OF MAGNESIA) 400 MG/5ML suspension   Oral   Take 30 mLs by mouth daily as needed for mild constipation.         . mirtazapine (REMERON) 15 MG tablet   Oral   Take 15 mg by mouth at bedtime.         Marland Kitchen  neomycin-bacitracin-polymyxin (NEOSPORIN) [phone] ointment   Topical   Apply 1 application topically as needed (wound care). Pt applies to right upper arm.         Marland Kitchen omeprazole (PRILOSEC) 20 MG capsule   Oral   Take 20 mg by mouth daily.         . potassium chloride (K-DUR,KLOR-CON) 10 MEQ tablet   Oral   Take 10 mEq by mouth daily.         . Probiotic Product (PHILLIPS COLON HEALTH PO)   Oral   Take 1 capsule by mouth 2 (two) times daily.         . QUEtiapine (SEROQUEL) 50 MG tablet   Oral   Take 50 mg by mouth 2 (two) times daily.         . Vitamin D, Ergocalciferol, (DRISDOL) 50000 units CAPS capsule   Oral   Take 50,000 Units by mouth once a week. Pt takes on Tuesday.           Allergies Review of patient's allergies indicates no known allergies.  Family History  Problem Relation Age of Onset  . Hypertension Father     deceased    Social History Social History  Substance Use Topics  . Smoking status: Never Smoker   . Smokeless tobacco: None     Comment: tobacco use -no  . Alcohol Use: No    Review of  Systems Constitutional: No fever/chills Eyes: No visual changes. ENT: No sore throat. Cardiovascular: Denies chest pain. Respiratory: Denies shortness of breath. Gastrointestinal: No abdominal pain.   Genitourinary: Negative for dysuria. Musculoskeletal: Negative for back pain. Skin: Negative for rash. Neurological: Negative for headaches  10-point ROS otherwise negative.  ____________________________________________   PHYSICAL EXAM:  VITAL SIGNS: ED Triage Vitals  Enc Vitals Group     BP --      Pulse Rate 08/28/15 0441 55     Resp 08/28/15 0441 17     Temp 08/28/15 0441 98 F (36.7 C)     Temp Source 08/28/15 0441 Oral     SpO2 08/28/15 0441 96 %     Weight 08/28/15 0441 150 lb (68.04 kg)     Height 08/28/15 0441 5\' 8"  (1.727 m)     Head Cir --      Peak Flow --      Pain Score --      Pain Loc --      Pain Edu? --      Excl. in [place]? --     Constitutional: Alert,  Well appearing and in no acute distress. Eyes: Conjunctivae are normal. PERRL. EOMI. Head: Atraumatic. Nose: No congestion/rhinnorhea. Mouth/Throat: Mucous membranes are moist.  Oropharynx non-erythematous. Neck: No cervical spine tenderness to palpation. Cardiovascular: Normal rate, regular rhythm. Grossly normal heart sounds.  Good peripheral circulation. Respiratory: Normal respiratory effort.  No retractions. Lungs CTAB. Gastrointestinal: Soft and nontender. No distention. Positive bowel sounds Musculoskeletal: No lower extremity tenderness nor edema.   Neurologic:  Normal speech and language. Patient with some left-sided facial droop that improves with smile no other focal neurologic deficits. Skin:  Skin is warm, dry and intact.  Psychiatric: Mood and affect are normal.   ____________________________________________   LABS (all labs ordered are listed, but only abnormal results are displayed)  Labs Reviewed  CBC - Abnormal; Notable for the following:    RBC 4.30 (*)    Hemoglobin 11.5 (*)     HCT 34.7 (*)    RDW 17.2 (*)  All other components within normal limits  COMPREHENSIVE METABOLIC PANEL - Abnormal; Notable for the following:    Potassium 3.4 (*)    Alkaline Phosphatase 137 (*)    All other components within normal limits  TROPONIN I - Abnormal; Notable for the following:    Troponin I 0.03 (*)    All other components within normal limits  URINALYSIS COMPLETEWITH MICROSCOPIC ([facility] ONLY)   ____________________________________________  EKG  ED ECG REPORT I, Loney Hering, the attending physician, personally viewed and interpreted this ECG.   Date: 08/28/2015  EKG Time: 443  Rate: 48  Rhythm: sinus bradycardia  Axis: normal  Intervals:right bundle branch block  ST&T Change: flipped t wave in lead 3  ____________________________________________  RADIOLOGY  CT head and cervical spine: No evidence of traumatic intracranial injury or fracture, no evidence of fracture or subluxation along the cervical spine, moderate cortical volume loss noted, mild degenerative change along the cervical spine, mild calcification at the carotid bifurcations bilaterally. ____________________________________________   PROCEDURES  Procedure(s) performed: None  Procedures  Critical Care performed: No  ____________________________________________   INITIAL IMPRESSION / ASSESSMENT AND PLAN / ED COURSE  Pertinent labs & imaging results that were available during my care of the patient were reviewed by me and considered in my medical decision making (see chart for details).  This is a 82 year old male who was brought into the hospital today after a fall at his nursing home. The patient has fallen previously and has a history of dementia. While we are evaluating the patient we noticed that he had some significant episodes of bradycardia with his heart rates dipping into the high 20s. The patient has no complaints at this time but I feel that the patient needs to be  admitted to the hospital for further evaluation of his bradycardia. The patient is DO NOT RESUSCITATE but again is having some significant episodes of bradycardia.  I contacted the patient's family and they felt it would be appropriate for him to be admitted for evaluation. He has continued to have some moments of bradycardia but again he does rebound into the 60s. The patient will be admitted to the hospitalist service. ____________________________________________   FINAL CLINICAL IMPRESSION(S) / ED DIAGNOSES  Final diagnoses:  Bradycardia  Fall, initial encounter      NEW MEDICATIONS STARTED DURING THIS VISIT:  New Prescriptions   No medications on file     Note:  This document was prepared using Dragon voice recognition software and may include unintentional dictation errors.    Loney Hering, MD 08/28/15 [phone]

## 2015-08-28 NOTE — ED Notes (Signed)
Attempted to call report, RN unable to do so; Agricultural consultant not at desk per Network engineer, [employer].

## 2015-08-28 NOTE — Progress Notes (Signed)
PHARMACIST - PHYSICIAN ORDER COMMUNICATION  CONCERNING: P&T Medication Policy on Herbal Medications  DESCRIPTION:  This patient's order for:  Cody Gordon's colon health capsules  has been noted.  This product(s) is classified as an "herbal" or natural product. Due to a lack of definitive safety studies or FDA approval, nonstandard manufacturing practices, plus the potential risk of unknown drug-drug interactions while on inpatient medications, the Pharmacy and Therapeutics Committee does not permit the use of "herbal" or natural products of this type within [facility].   ACTION TAKEN: The pharmacy department is unable to verify this order at this time and your patient has been informed of this safety policy. Please reevaluate patient's clinical condition at discharge and address if the herbal or natural product(s) should be resumed at that time.  Darylene Price Cody Gordon 11:06 AM

## 2015-08-28 NOTE — H&P (Signed)
[place] [facility] [place] NAME: Cody Gordon    MR#:  [id number]  DATE OF BIRTH:  1920/03/13  DATE OF ADMISSION:  08/28/2015  PRIMARY CARE PHYSICIAN: Pcp Not In System   REQUESTING/REFERRING PHYSICIAN: Dr. Dahlia Client  CHIEF COMPLAINT:   Chief Complaint  Patient presents with  . Fall    HISTORY OF PRESENT ILLNESS:  Cody Gordon  is a 82 y.o. male with a known history of Sick sinus syndrome, dementia, and DVT presents to the emergency room due to fall. Patient has had recurrent admissions for falls, syncope with bradycardia. During his last admission his Aricept was stopped. Patient had heart rate dipping into the 20s in the emergency room. Patient is being admitted for pacemaker placement.  Patient is a poor historian due to dementia.  PAST MEDICAL HISTORY:   Past Medical History  Diagnosis Date  . Neoplasm, bladder   . Prostate cancer ([place])   . Sinus bradycardia   . Nephrolithiasis   . Incomplete emptying of bladder   . Hematuria   . Dementia   . Anemia   . DVT (deep venous thrombosis) ([facility])     PE also  . 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 Diverticulosis of intestine with bleeding     PAST SURGICAL HISTORY:   Past Surgical History  Procedure Laterality Date  . Back surgery      SOCIAL HISTORY:   Social History  Substance Use Topics  . Smoking status: Never Smoker   . Smokeless tobacco: Not on file     Comment: tobacco use -no  . Alcohol Use: No    FAMILY HISTORY:   Family History  Problem Relation Age of Onset  . Hypertension Father     deceased    DRUG ALLERGIES:  No Known Allergies  REVIEW OF SYSTEMS:   Review of Systems  Unable to perform ROS: dementia    MEDICATIONS AT HOME:   Prior to Admission medications   Medication Sig Start Date End Date Taking? Authorizing Provider  acetaminophen (TYLENOL) 325 MG tablet Take 650 mg by mouth every 4 (four) hours as needed for mild  pain, fever or headache.    Yes Historical Provider, MD  acetaminophen (TYLENOL) 500 MG tablet Take 500 mg by mouth every 4 (four) hours as needed for mild pain.    Yes Historical Provider, MD  alum & mag hydroxide-simeth (MAALOX PLUS) 400-400-40 MG/5ML suspension Take 15 mLs by mouth every 6 (six) hours as needed for indigestion.   Yes Historical Provider, MD  bisacodyl (BISACODYL) 5 MG EC tablet Take 10 mg by mouth daily as needed for moderate constipation.   Yes Historical Provider, MD  donepezil (ARICEPT) 5 MG tablet Take 5 mg by mouth at bedtime.   Yes Historical Provider, MD  guaifenesin (ROBITUSSIN) 100 MG/5ML syrup Take 10 mLs by mouth 3 (three) times daily as needed for cough.   Yes Historical Provider, MD  loperamide (IMODIUM A-D) 2 MG tablet Take 2 mg by mouth as needed for diarrhea or loose stools.   Yes Historical Provider, MD  magnesium hydroxide (MILK OF MAGNESIA) 400 MG/5ML suspension Take 30 mLs by mouth daily as needed for mild constipation.   Yes Historical Provider, MD  mirtazapine (REMERON) 15 MG tablet Take 15 mg by mouth at bedtime.   Yes Historical Provider, MD  neomycin-bacitracin-polymyxin (NEOSPORIN) [phone] ointment Apply 1 application topically as needed (wound care). Pt applies to right upper  arm.   Yes Historical Provider, MD  omeprazole (PRILOSEC) 20 MG capsule Take 20 mg by mouth daily.   Yes Historical Provider, MD  potassium chloride (K-DUR,KLOR-CON) 10 MEQ tablet Take 10 mEq by mouth daily.   Yes Historical Provider, MD  Probiotic Product ([place]) Take 1 capsule by mouth 2 (two) times daily.   Yes Historical Provider, MD  QUEtiapine (SEROQUEL) 50 MG tablet Take 50 mg by mouth 2 (two) times daily.   Yes Historical Provider, MD  Vitamin D, Ergocalciferol, (DRISDOL) 50000 units CAPS capsule Take 50,000 Units by mouth once a week. Pt takes on Tuesday.   Yes Historical Provider, MD     VITAL SIGNS:  Blood pressure 162/70, pulse 52, temperature  97.7 F (36.5 C), temperature source Oral, resp. rate 16, height 5\' 8"  (1.727 m), weight 71.532 kg (157 lb 11.2 oz), SpO2 97 %.  PHYSICAL EXAMINATION:  Physical Exam  GENERAL:  82 y.o.-year-old patient lying in the bed with no acute distress.  EYES: Pupils equal, round, reactive to light and accommodation. No scleral icterus. Extraocular muscles intact.  HEENT: Head atraumatic, normocephalic. Oropharynx and nasopharynx clear. No oropharyngeal erythema, moist oral mucosa  NECK:  Supple, no jugular venous distention. No thyroid enlargement, no tenderness.  LUNGS: Normal breath sounds bilaterally, no wheezing, rales, rhonchi. No use of accessory muscles of respiration.  CARDIOVASCULAR: S1, S2 normal. No murmurs, rubs, or gallops. Bradycardia ABDOMEN: Soft, nontender, nondistended. Bowel sounds present. No organomegaly or mass.  EXTREMITIES: No pedal edema, cyanosis, or clubbing. + 2 pedal & radial pulses b/l.   NEUROLOGIC: Cranial nerves II through XII are intact. No focal Motor or sensory deficits appreciated b/l PSYCHIATRIC: The patient is alert and awake. Pleasantly confused. SKIN: Scattered excoriations and bruising in his arms around his elbows with falls.  LABORATORY PANEL:   CBC  Recent Labs Lab 08/28/15 0508  WBC 5.5  HGB 11.5*  HCT 34.7*  PLT 198   ------------------------------------------------------------------------------------------------------------------  Chemistries   Recent Labs Lab 08/28/15 0508  NA 142  K 3.4*  CL 110  CO2 25  GLUCOSE 99  BUN 15  CREATININE 0.90  CALCIUM 9.4  AST 34  ALT 22  ALKPHOS 137*  BILITOT 0.7   ------------------------------------------------------------------------------------------------------------------  Cardiac Enzymes  Recent Labs Lab 08/28/15 0508  TROPONINI 0.03*   ------------------------------------------------------------------------------------------------------------------  RADIOLOGY:  Ct Head Wo  Contrast  08/28/2015  CLINICAL DATA:  Status post unwitnessed fall, with knot at the back of the head. Confusion. Concern for head or cervical spine injury. Initial encounter. EXAM: CT HEAD WITHOUT CONTRAST CT CERVICAL SPINE WITHOUT CONTRAST TECHNIQUE: Multidetector CT imaging of the head and cervical spine was performed following the standard protocol without intravenous contrast. Multiplanar CT image reconstructions of the cervical spine were also generated. COMPARISON:  CT of the head and cervical spine performed 08/21/2015 FINDINGS: CT HEAD FINDINGS There is no evidence of acute infarction, mass lesion, or intra- or extra-axial hemorrhage on CT. Prominence of ventricles and sulci reflects moderate cortical volume loss. Cerebellar atrophy is noted. The brainstem and fourth ventricle are within normal limits. The basal ganglia are unremarkable in appearance. The cerebral hemispheres demonstrate grossly normal gray-white differentiation. No mass effect or midline shift is seen. There is no evidence of fracture; visualized osseous structures are unremarkable in appearance. The orbits are within normal limits. The paranasal sinuses and mastoid air cells are well-aerated. No significant soft tissue abnormalities are seen. CT CERVICAL SPINE FINDINGS There is no evidence of fracture or  subluxation. There is minimal grade 1 anterolisthesis of C5 on C6, of C6 on C7, of C7 on T1, and of T1 on T2. Scattered anterior and posterior disc osteophyte complexes are seen along the cervical spine. Underlying facet disease is noted. Prevertebral soft tissues are within normal limits. The thyroid gland is unremarkable in appearance. The visualized lung apices are clear. Mild calcification is noted at the carotid bifurcations bilaterally. IMPRESSION: 1. No evidence of traumatic intracranial injury or fracture. 2. No evidence of fracture or subluxation along the cervical spine. 3. Moderate cortical volume loss noted. 4. Mild  degenerative change along the cervical spine. 5. Mild calcification at the carotid bifurcations bilaterally. Carotid ultrasound would be helpful for further evaluation, when and as deemed clinically appropriate. Electronically Signed   By: Garald Balding M.D.   On: 08/28/2015 06:51   Ct Cervical Spine Wo Contrast  08/28/2015  CLINICAL DATA:  Status post unwitnessed fall, with knot at the back of the head. Confusion. Concern for head or cervical spine injury. Initial encounter. EXAM: CT HEAD WITHOUT CONTRAST CT CERVICAL SPINE WITHOUT CONTRAST TECHNIQUE: Multidetector CT imaging of the head and cervical spine was performed following the standard protocol without intravenous contrast. Multiplanar CT image reconstructions of the cervical spine were also generated. COMPARISON:  CT of the head and cervical spine performed 08/21/2015 FINDINGS: CT HEAD FINDINGS There is no evidence of acute infarction, mass lesion, or intra- or extra-axial hemorrhage on CT. Prominence of ventricles and sulci reflects moderate cortical volume loss. Cerebellar atrophy is noted. The brainstem and fourth ventricle are within normal limits. The basal ganglia are unremarkable in appearance. The cerebral hemispheres demonstrate grossly normal gray-white differentiation. No mass effect or midline shift is seen. There is no evidence of fracture; visualized osseous structures are unremarkable in appearance. The orbits are within normal limits. The paranasal sinuses and mastoid air cells are well-aerated. No significant soft tissue abnormalities are seen. CT CERVICAL SPINE FINDINGS There is no evidence of fracture or subluxation. There is minimal grade 1 anterolisthesis of C5 on C6, of C6 on C7, of C7 on T1, and of T1 on T2. Scattered anterior and posterior disc osteophyte complexes are seen along the cervical spine. Underlying facet disease is noted. Prevertebral soft tissues are within normal limits. The thyroid gland is unremarkable in  appearance. The visualized lung apices are clear. Mild calcification is noted at the carotid bifurcations bilaterally. IMPRESSION: 1. No evidence of traumatic intracranial injury or fracture. 2. No evidence of fracture or subluxation along the cervical spine. 3. Moderate cortical volume loss noted. 4. Mild degenerative change along the cervical spine. 5. Mild calcification at the carotid bifurcations bilaterally. Carotid ultrasound would be helpful for further evaluation, when and as deemed clinically appropriate. Electronically Signed   By: Garald Balding M.D.   On: 08/28/2015 06:51     IMPRESSION AND PLAN:   * Symptomatic bradycardia with syncopal episodes Patient has had recurrent admissions for the same problem. Aricept was held last time. Will need pacemaker. Consult cardiology. Admit to telemetry. Fall precautions.  * Dementia Watch for inpatient delirium  * History of DVT Patient seems to be on Eliquis although not on his medication list. Need to get complete home medication list.   All the records are reviewed and case discussed with ED provider. Management plans discussed with the patient, family and they are in agreement.  CODE STATUS: DNR  TOTAL TIME TAKING CARE OF THIS PATIENT: 40 minutes.   Neita Carp M.D on  08/28/2015 at 10:49 AM  Between 7am to 6pm - Pager - [phone]  After 6pm go to [email or web address] - password [email or web address] [facility]  Office  [phone]  CC: Primary care physician; Pcp Not In System  Note: This dictation was prepared with Dragon dictation along with smaller phrase technology. Any transcriptional errors that result from this process are unintentional.

## 2015-08-28 NOTE — Progress Notes (Signed)
DR Rockey Situ was made aware of pt's abnormal rhythm with HR going to the 190's ,stated he will take a look at it , will continue to monitor

## 2015-08-28 NOTE — ED Notes (Signed)
EMS reports pt had unwitnessed fall at [facility].  Pt also had a fall 1 week ago with a knot to the back of head.  Pt is confused at baseline, conversing appropriately.  Alert to self, situation and place.

## 2015-08-28 NOTE — ED Notes (Signed)
Report from [place], [place]

## 2015-08-28 NOTE — ED Notes (Signed)
Pt family agrees for patient admission to address bradycardia. Pt continues to rest in room, lights dimmed, eyes closed, RR even and unlabored. Color WNL.

## 2015-08-28 NOTE — ED Notes (Addendum)
Pt experiencing moments of bradycardia, HR down to 29-32. MD aware. MD on phone with patient next of kin, Silva Bandy to discuss.

## 2015-08-28 NOTE — ED Notes (Signed)
EDP notified of troponin.  

## 2015-08-28 NOTE — Consult Note (Signed)
Cardiology Consultation Note  Patient ID: Cody Drumm., MRN: [id number], DOB/AGE: 03-29-1920 81 y.o. Admit date: 08/28/2015   Date of Consult: 08/28/2015 Primary Physician: Pcp Not In System Primary Cardiologist: Caryl Comes  Chief Complaint: Fall, bradycardia Reason for Consult: Bradycardia, fall Consult placed by Dr. Darvin Neighbours  HPI: 82 y.o. male with h/o Severe dementia with periods of behavioral disturbance, prostate cancer, kidney stones, [facility], long-standing sinus bradycardia with heart rates in the 40s, several hospital admissions for fall dating back to 2015, who presents again after a fall. Cardiology was consult in for bradycardia.  Unable to obtain any history from the patient Emergency room physician notes indicates he was "helping his wife" when he fell. He denied any lightheadedness, dizziness Details unclear. He had traumatic injury to the back of his head requiring stitches Nursing reports he is not using his walker, perhaps occasionally when he remembers He is not yet been evaluated by physical therapy Orthostatics in the chart show stable if not elevated blood pressure Orthostatics also show a climb in his heart rate with standing up to heart rate 96 bpm  Previous notes such as Jul 11, 2015 detailing mechanical fall with admission at that time. Reportedly lost his balance while walking in the hallway. For bradycardia at that time, Aricept was held  Previous attempts to evaluate his bradycardia/sick sinus syndrome as an outpatient have been unsuccessful. Due to behavioral disturbance, he was unable to wear a 30 day monitor and this was discontinued after discussion with his family. He kept pulling off the leads They mentioned that he was DO NOT RESUSCITATE and would likely be of little benefit.      Past Medical History  Diagnosis Date  . Neoplasm, bladder   . Prostate cancer ([facility])   . Sinus bradycardia   . Nephrolithiasis   . Incomplete  emptying of bladder   . Hematuria   . Dementia   . Anemia   . DVT (deep venous thrombosis) ([facility])     PE also  . 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 Diverticulosis of intestine with bleeding       Most Recent Cardiac Studies: Previous echocardiogram 2015-07-11 with ejection fraction 50-55%    Surgical History:  Past Surgical History  Procedure Laterality Date  . Back surgery       Home Meds: Prior to Admission medications   Medication Sig Start Date End Date Taking? Authorizing Provider  acetaminophen (TYLENOL) 325 MG tablet Take 650 mg by mouth every 4 (four) hours as needed for mild pain, fever or headache.    Yes Historical Provider, MD  acetaminophen (TYLENOL) 500 MG tablet Take 500 mg by mouth every 4 (four) hours as needed for mild pain.    Yes Historical Provider, MD  alum & mag hydroxide-simeth (MAALOX PLUS) 400-400-40 MG/5ML suspension Take 15 mLs by mouth every 6 (six) hours as needed for indigestion.   Yes Historical Provider, MD  bisacodyl (BISACODYL) 5 MG EC tablet Take 10 mg by mouth daily as needed for moderate constipation.   Yes Historical Provider, MD  donepezil (ARICEPT) 5 MG tablet Take 5 mg by mouth at bedtime.   Yes Historical Provider, MD  guaifenesin (ROBITUSSIN) 100 MG/5ML syrup Take 10 mLs by mouth 3 (three) times daily as needed for cough.   Yes Historical Provider, MD  loperamide (IMODIUM A-D) 2 MG tablet Take 2 mg by mouth as needed for diarrhea or loose stools.   Yes Historical Provider,  MD  magnesium hydroxide (MILK OF MAGNESIA) 400 MG/5ML suspension Take 30 mLs by mouth daily as needed for mild constipation.   Yes Historical Provider, MD  mirtazapine (REMERON) 15 MG tablet Take 15 mg by mouth at bedtime.   Yes Historical Provider, MD  neomycin-bacitracin-polymyxin (NEOSPORIN) [phone] ointment Apply 1 application topically as needed (wound care). Pt applies to right upper arm.   Yes Historical Provider, MD  omeprazole (PRILOSEC) 20 MG  capsule Take 20 mg by mouth daily.   Yes Historical Provider, MD  potassium chloride (K-DUR,KLOR-CON) 10 MEQ tablet Take 10 mEq by mouth daily.   Yes Historical Provider, MD  Probiotic Product ([place]) Take 1 capsule by mouth 2 (two) times daily.   Yes Historical Provider, MD  QUEtiapine (SEROQUEL) 50 MG tablet Take 50 mg by mouth 2 (two) times daily.   Yes Historical Provider, MD  Vitamin D, Ergocalciferol, (DRISDOL) 50000 units CAPS capsule Take 50,000 Units by mouth once a week. Pt takes on Tuesday.   Yes Historical Provider, MD    Inpatient Medications:  . docusate sodium  100 mg Oral BID  . enoxaparin (LOVENOX) injection  40 mg Subcutaneous Q24H  . mirtazapine  15 mg Oral QHS  . pantoprazole  40 mg Oral Daily  . potassium chloride  10 mEq Oral Daily  . QUEtiapine  50 mg Oral BID  . sodium chloride flush  3 mL Intravenous Q12H      Allergies: No Known Allergies  Social History   Social History  . Marital Status: Married    Spouse Name: N/A  . Number of Children: N/A  . Years of Education: N/A   Occupational History  . retired    Social History Main Topics  . Smoking status: Never Smoker   . Smokeless tobacco: Not on file     Comment: tobacco use -no  . Alcohol Use: No  . Drug Use: No  . Sexual Activity: Not on file   Other Topics Concern  . Not on file   Social History Narrative   Married, retired, does not get regular exercise.      Family History  Problem Relation Age of Onset  . Hypertension Father     deceased     Review of Systems: Review of Systems  Unable to perform ROS   Labs:  Recent Labs  08/28/15 0508  TROPONINI 0.03*   Lab Results  Component Value Date   WBC 5.5 08/28/2015   HGB 11.5* 08/28/2015   HCT 34.7* 08/28/2015   MCV 80.8 08/28/2015   PLT 198 08/28/2015    Recent Labs Lab 08/28/15 0508  NA 142  K 3.4*  CL 110  CO2 25  BUN 15  CREATININE 0.90  CALCIUM 9.4  PROT 6.6  BILITOT 0.7  ALKPHOS 137*    ALT 22  AST 34  GLUCOSE 99   No results found for: CHOL, HDL, LDLCALC, TRIG No results found for: DDIMER  Radiology/Studies:  Ct Head Wo Contrast  08/28/2015  CLINICAL DATA:  Status post unwitnessed fall, with knot at the back of the head. Confusion. Concern for head or cervical spine injury. Initial encounter. EXAM: CT HEAD WITHOUT CONTRAST CT CERVICAL SPINE WITHOUT CONTRAST TECHNIQUE: Multidetector CT imaging of the head and cervical spine was performed following the standard protocol without intravenous contrast. Multiplanar CT image reconstructions of the cervical spine were also generated. COMPARISON:  CT of the head and cervical spine performed 08/21/2015 FINDINGS: CT HEAD FINDINGS There is  no evidence of acute infarction, mass lesion, or intra- or extra-axial hemorrhage on CT. Prominence of ventricles and sulci reflects moderate cortical volume loss. Cerebellar atrophy is noted. The brainstem and fourth ventricle are within normal limits. The basal ganglia are unremarkable in appearance. The cerebral hemispheres demonstrate grossly normal gray-white differentiation. No mass effect or midline shift is seen. There is no evidence of fracture; visualized osseous structures are unremarkable in appearance. The orbits are within normal limits. The paranasal sinuses and mastoid air cells are well-aerated. No significant soft tissue abnormalities are seen. CT CERVICAL SPINE FINDINGS There is no evidence of fracture or subluxation. There is minimal grade 1 anterolisthesis of C5 on C6, of C6 on C7, of C7 on T1, and of T1 on T2. Scattered anterior and posterior disc osteophyte complexes are seen along the cervical spine. Underlying facet disease is noted. Prevertebral soft tissues are within normal limits. The thyroid gland is unremarkable in appearance. The visualized lung apices are clear. Mild calcification is noted at the carotid bifurcations bilaterally. IMPRESSION: 1. No evidence of traumatic intracranial  injury or fracture. 2. No evidence of fracture or subluxation along the cervical spine. 3. Moderate cortical volume loss noted. 4. Mild degenerative change along the cervical spine. 5. Mild calcification at the carotid bifurcations bilaterally. Carotid ultrasound would be helpful for further evaluation, when and as deemed clinically appropriate. Electronically Signed   By: Garald Balding M.D.   On: 08/28/2015 06:51   Ct Head Wo Contrast  08/21/2015  CLINICAL DATA:  Fall with occipital scalp laceration. Initial encounter. EXAM: CT HEAD WITHOUT CONTRAST CT CERVICAL SPINE WITHOUT CONTRAST TECHNIQUE: Multidetector CT imaging of the head and cervical spine was performed following the standard protocol without intravenous contrast. Multiplanar CT image reconstructions of the cervical spine were also generated. COMPARISON:  06/23/2015 head CT.  03/10/2015 cervical spine CT FINDINGS: CT HEAD FINDINGS Brain: No evidence of acute infarction, hemorrhage, hydrocephalus, or mass lesion/mass effect. Moderate generalized atrophy. Vascular: No hyperdense vessel or unexpected calcification. Skull: High right parietal scalp laceration with skin staples. Negative for calvarial fracture. Sinuses/Orbits: No acute finding. CT CERVICAL SPINE FINDINGS Alignment: No traumatic malalignment. C5-6, C7-T1, and T1-T2 anterolisthesis is mild and facet mediated. Skull base and vertebrae: No  acute fracture. No aggressive process. Soft tissues and canal: No prevertebral fluid. No gross canal hematoma. Degenerative: Diffuse facet arthropathy. Lower cervical and upper thoracic spondylosis and disc narrowing. Extensive chondrocalcinosis of discs and posterior ligaments. Probable associated erosions in the odontoid tip suggesting a degree of CPPD arthropathy. IMPRESSION: 1. No evidence of acute intracranial or cervical spine injury. 2. Scalp laceration without calvarial fracture Electronically Signed   By: Monte Fantasia M.D.   On: 08/21/2015 07:09    Ct Cervical Spine Wo Contrast  08/28/2015  CLINICAL DATA:  Status post unwitnessed fall, with knot at the back of the head. Confusion. Concern for head or cervical spine injury. Initial encounter. EXAM: CT HEAD WITHOUT CONTRAST CT CERVICAL SPINE WITHOUT CONTRAST TECHNIQUE: Multidetector CT imaging of the head and cervical spine was performed following the standard protocol without intravenous contrast. Multiplanar CT image reconstructions of the cervical spine were also generated. COMPARISON:  CT of the head and cervical spine performed 08/21/2015  IMPRESSION: 1. No evidence of traumatic intracranial injury or fracture. 2. No evidence of fracture or subluxation along the cervical spine. 3. Moderate cortical volume loss noted. 4. Mild degenerative change along the cervical spine. 5. Mild calcification at the carotid bifurcations bilaterally. Carotid ultrasound would be  helpful for further evaluation, when and as deemed clinically appropriate. Electronically Signed   By: Garald Balding M.D.   On: 08/28/2015 06:51   Ct Cervical Spine Wo Contrast  08/21/2015   IMPRESSION: 1. No evidence of acute intracranial or cervical spine injury. 2. Scalp laceration without calvarial fracture Electronically Signed   By: Monte Fantasia M.D.   On: 08/21/2015 07:09   Dg Chest Portable 1 View  08/21/2015  CLINICAL DATA:  IMPRESSION: No active disease. Electronically Signed   By: Andreas Newport M.D.   On: 08/21/2015 06:16    EKG: Sinus bradycardia, right bundle branch block  Weights: Filed Weights   08/28/15 0441 08/28/15 0932  Weight: 150 lb (68.04 kg) 157 lb 11.2 oz (71.532 kg)     Physical Exam: Blood pressure 162/70, pulse 52, temperature 97.7 F (36.5 C), temperature source Oral, resp. rate 16, height 5\' 8"  (1.727 m), weight 157 lb 11.2 oz (71.532 kg), SpO2 97 %. Body mass index is 23.98 kg/(m^2). General: Well developed, well nourished, in no acute distress. Head: Normocephalic, atraumatic, sclera  non-icteric, no xanthomas, nares are without discharge.  Neck: Negative for carotid bruits. JVD not elevated. Lungs: Clear bilaterally to auscultation without wheezes, rales, or rhonchi. Breathing is unlabored. Heart: RRR with S1 S2. No murmurs, rubs, or gallops appreciated. Abdomen: Soft, non-tender, non-distended with normoactive bowel sounds. No hepatomegaly. No rebound/guarding. No obvious abdominal masses. Msk:  Strength and tone appear normal for age. Extremities: No clubbing or cyanosis. No edema.  Distal pedal pulses are 2+ and equal bilaterally. Neuro: Alert and oriented X 3. No facial asymmetry. No focal deficit. Moves all extremities spontaneously. Psych:  Responds to questions appropriately with a normal affect.    Assessment and Plan:   1. Sinus bradycardia Long history of bradycardia, notes from cardiology dating back to September 2011 Blood pressure typically 130s to 150s, no evidence of hypotension Orthostatics showing dramatic climb in blood pressure with movement Bradycardia at rest in bed and while sleeping Likely of limited clinical significance  -Would recommend physical therapy stand, try to ambulate patients with close monitoring of pulse If reasonable rate on exertion, would not recommend pacemaker  2. Tachycardia/arrhythmia Short rhythm noted on telemetry concerning for very rapid rhythm, Unable to exclude  VT,  There was suggestion of VT while having ureter stent removed in 2011 no telemetry strips were not available at that time for review No change in medical management at this time, will continue to monitor telemetry If he has recurrent VT , this would be difficult to manage given medication such as amiodarone would cause bradycardia Would need discussion with family about goals of care at that time  3) severe dementia Previously seen by psychiatry Periods of behavioral disturbance Appears stable at this time. Notes indicating he has required Ativan and  other medications in the past  4) weakness Long history of falls and weakness dating back several years, notes back in 2011 reporting falls at that time from leg weakness Ideally should not be able to ambulate without assistance or monitoring Should be using walker at all times  5) history of DVT, PE Bilateral PE October 2010, IVC filter in place   Extensive review of prior notes dating back to 2011 Discussed history and telemetry findings with nursing They will call if he has recurrent VT  Total encounter time more than 110 minutes  Greater than 50% was spent in counseling and coordination of care with the patient   Signed, Esmond Plants,  MD. Ph.D [facility] 08/28/2015, 2:32 PM

## 2015-08-28 NOTE — Progress Notes (Signed)
DR Darvin Neighbours WAS MADE AWARE OF PT HR IN THE 30's AND HAVING 2.04 SEC. PAUSE , CARDIOLOGY CONSULT CALL AND ATROPINE ORDER FOR HR <30, WILL CONTINUE TO MONITOR

## 2015-08-29 DIAGNOSIS — R001 Bradycardia, unspecified: Secondary | ICD-10-CM

## 2015-08-29 LAB — MAGNESIUM: Magnesium: 1.5 mg/dL — ABNORMAL LOW (ref 1.7–2.4)

## 2015-08-29 LAB — POTASSIUM: POTASSIUM: 3.5 mmol/L (ref 3.5–5.1)

## 2015-08-29 MED ORDER — MAGNESIUM SULFATE 2 GM/50ML IV SOLN
2.0000 g | Freq: Once | INTRAVENOUS | Status: AC
  Administered 2015-08-29: 2 g via INTRAVENOUS
  Filled 2015-08-29: qty 50

## 2015-08-29 MED ORDER — CEPHALEXIN 500 MG PO CAPS
500.0000 mg | ORAL_CAPSULE | Freq: Two times a day (BID) | ORAL | Status: DC
  Administered 2015-08-29 – 2015-08-30 (×4): 500 mg via ORAL
  Filled 2015-08-29 (×4): qty 1

## 2015-08-29 NOTE — Progress Notes (Signed)
Notified by CMD that patient's HR is 28 and 2nd degree HB type ll. Notified  Dr. Marcille Blanco. EKG ordered and atropine PRN ordered given. Will continue to monitor.

## 2015-08-29 NOTE — Progress Notes (Addendum)
CSW was informed by [facility] that patient is from [place]. CSW contacted [facility]. Per their Care Coordinator department patient is a resident in their ALF section of the facility. Reported that he uses a walker to get around. Reported that patient can return at discharge. FL2 completed but has not been placed in chart due to CSW having to add discharge medications to it at discharge. Patient assessment to follow. CSW will continue to follow and assist.  Ernest Pine, MSW, [place], [place] Social Worker [phone]

## 2015-08-29 NOTE — Progress Notes (Signed)
Patient: Cody Gordon. / Admit Date: 08/28/2015 / Date of Encounter: 08/29/2015, 10:27 AM   Subjective: Resting this morning, while sleeping heart rate in the 30s up to 40, sinus bradycardia, asymptomatic Very agitated overnight, requiring a sitter, redirection, sedation medication Nurses reported they had to give atropine overnight for periodic heart rate in the 20s Patient again was asymptomatic during this time, will resting in bed  Review of Systems: ROS All other systems reviewed and negative.   Objective: Telemetry:  Physical Exam: Blood pressure 137/73, pulse 37, temperature 98.8 F (37.1 C), temperature source Oral, resp. rate 20, height 5\' 8"  (1.727 m), weight 157 lb 4.8 oz (71.351 kg), SpO2 99 %. Body mass index is 23.92 kg/(m^2). General:  in no acute distress, sleeping Head: Normocephalic, atraumatic Neck: Negative for carotid bruits. JVD could not be examined though grossly normal Lungs: Clear bilaterally to auscultation without wheezes, rales, or rhonchi. Breathing is unlabored. Heart: RRR with S1 S2, bradycardia No murmurs, rubs, or gallops appreciated. Abdomen: Soft, non-tender, non-distended with normoactive bowel sounds. No hepatomegaly. No rebound/guarding. No obvious abdominal masses. Msk: Strength and tone appear normal for age. Extremities: No clubbing or cyanosis. No edema. Distal pedal pulses are 2+ and equal bilaterally. Neuro: Sleeping this morning, No facial asymmetry. No focal deficit. Moves all extremities spontaneously. Psych: Responds to questions, not always appropriate   Intake/Output Summary (Last 24 hours) at 08/29/15 1027 Last data filed at 08/29/15 0954  Gross per 24 hour  Intake      0 ml  Output    425 ml  Net   -425 ml    Inpatient Medications:  . cephALEXin  500 mg Oral Q12H  . docusate sodium  100 mg Oral BID  . enoxaparin (LOVENOX) injection  40 mg Subcutaneous Q24H  . magnesium sulfate 1 - 4 g bolus IVPB  2 g  Intravenous Once  . mirtazapine  15 mg Oral QHS  . pantoprazole  40 mg Oral Daily  . potassium chloride  10 mEq Oral Daily  . QUEtiapine  50 mg Oral BID  . sodium chloride flush  3 mL Intravenous Q12H   Infusions:    Labs:  Recent Labs  08/28/15 0508 08/29/15 0826  NA 142  --   K 3.4* 3.5  CL 110  --   CO2 25  --   GLUCOSE 99  --   BUN 15  --   CREATININE 0.90  --   CALCIUM 9.4  --   MG  --  1.5*    Recent Labs  08/28/15 0508  AST 34  ALT 22  ALKPHOS 137*  BILITOT 0.7  PROT 6.6  ALBUMIN 3.7    Recent Labs  08/28/15 0508  WBC 5.5  HGB 11.5*  HCT 34.7*  MCV 80.8  PLT 198    Recent Labs  08/28/15 0508  TROPONINI 0.03*   Invalid input(s): POCBNP No results for input(s): HGBA1C in the last 72 hours.   Weights: Filed Weights   08/28/15 0441 08/28/15 0932 08/29/15 0440  Weight: 150 lb (68.04 kg) 157 lb 11.2 oz (71.532 kg) 157 lb 4.8 oz (71.351 kg)     Radiology/Studies:  Ct Head Wo Contrast  08/28/2015  CLINICAL DATA:  Status post unwitnessed fall, with knot at the back of the head. Confusion. Concern for head or cervical spine injury. Initial encounter. EXAM: CT HEAD WITHOUT CONTRAST CT CERVICAL SPINE WITHOUT CONTRAST TECHNIQUE: Multidetector CT imaging of the head and cervical spine  was performed following the standard protocol without intravenous contrast. Multiplanar CT image reconstructions of the cervical spine were also generated. COMPARISON:  CT of the head and cervical spine performed 08/21/2015 FINDINGS: CT HEAD FINDINGS There is no evidence of acute infarction, mass lesion, or intra- or extra-axial hemorrhage on CT. Prominence of ventricles and sulci reflects moderate cortical volume loss. Cerebellar atrophy is noted. The brainstem and fourth ventricle are within normal limits. The basal ganglia are unremarkable in appearance. The cerebral hemispheres demonstrate grossly normal gray-white differentiation. No mass effect or midline shift is seen.  There is no evidence of fracture; visualized osseous structures are unremarkable in appearance. The orbits are within normal limits. The paranasal sinuses and mastoid air cells are well-aerated. No significant soft tissue abnormalities are seen. CT CERVICAL SPINE FINDINGS There is no evidence of fracture or subluxation. There is minimal grade 1 anterolisthesis of C5 on C6, of C6 on C7, of C7 on T1, and of T1 on T2. Scattered anterior and posterior disc osteophyte complexes are seen along the cervical spine. Underlying facet disease is noted. Prevertebral soft tissues are within normal limits. The thyroid gland is unremarkable in appearance. The visualized lung apices are clear. Mild calcification is noted at the carotid bifurcations bilaterally. IMPRESSION: 1. No evidence of traumatic intracranial injury or fracture. 2. No evidence of fracture or subluxation along the cervical spine. 3. Moderate cortical volume loss noted. 4. Mild degenerative change along the cervical spine. 5. Mild calcification at the carotid bifurcations bilaterally. Carotid ultrasound would be helpful for further evaluation, when and as deemed clinically appropriate. Electronically Signed   By: Garald Balding M.D.   On: 08/28/2015 06:51   Ct Head Wo Contrast  08/21/2015  CLINICAL DATA:  Fall with occipital scalp laceration. Initial encounter. EXAM: CT HEAD WITHOUT CONTRAST CT CERVICAL SPINE WITHOUT CONTRAST TECHNIQUE: Multidetector CT imaging of the head and cervical spine was performed following the standard protocol without intravenous contrast. Multiplanar CT image reconstructions of the cervical spine were also generated. COMPARISON:  06/23/2015 head CT.  03/10/2015 cervical spine CT FINDINGS: CT HEAD FINDINGS Brain: No evidence of acute infarction, hemorrhage, hydrocephalus, or mass lesion/mass effect. Moderate generalized atrophy. Vascular: No hyperdense vessel or unexpected calcification. Skull: High right parietal scalp laceration  with skin staples. Negative for calvarial fracture. Sinuses/Orbits: No acute finding. CT CERVICAL SPINE FINDINGS Alignment: No traumatic malalignment. C5-6, C7-T1, and T1-T2 anterolisthesis is mild and facet mediated. Skull base and vertebrae: No  acute fracture. No aggressive process. Soft tissues and canal: No prevertebral fluid. No gross canal hematoma. Degenerative: Diffuse facet arthropathy. Lower cervical and upper thoracic spondylosis and disc narrowing. Extensive chondrocalcinosis of discs and posterior ligaments. Probable associated erosions in the odontoid tip suggesting a degree of CPPD arthropathy. IMPRESSION: 1. No evidence of acute intracranial or cervical spine injury. 2. Scalp laceration without calvarial fracture Electronically Signed   By: Monte Fantasia M.D.   On: 08/21/2015 07:09   Ct Cervical Spine Wo Contrast  08/28/2015  CLINICAL DATA:  Status post unwitnessed fall, with knot at the back of the head. Confusion. Concern for head or cervical spine injury. Initial encounter. EXAM: CT HEAD WITHOUT CONTRAST CT CERVICAL SPINE WITHOUT CONTRAST TECHNIQUE: Multidetector CT imaging of the head and cervical spine was performed following the standard protocol without intravenous contrast. Multiplanar CT image reconstructions of the cervical spine were also generated. COMPARISON:  CT of the head and cervical spine performed 08/21/2015 FINDINGS: CT HEAD FINDINGS There is no evidence of acute infarction,  mass lesion, or intra- or extra-axial hemorrhage on CT. Prominence of ventricles and sulci reflects moderate cortical volume loss. Cerebellar atrophy is noted. The brainstem and fourth ventricle are within normal limits. The basal ganglia are unremarkable in appearance. The cerebral hemispheres demonstrate grossly normal gray-white differentiation. No mass effect or midline shift is seen. There is no evidence of fracture; visualized osseous structures are unremarkable in appearance. The orbits are within  normal limits. The paranasal sinuses and mastoid air cells are well-aerated. No significant soft tissue abnormalities are seen. CT CERVICAL SPINE FINDINGS There is no evidence of fracture or subluxation. There is minimal grade 1 anterolisthesis of C5 on C6, of C6 on C7, of C7 on T1, and of T1 on T2. Scattered anterior and posterior disc osteophyte complexes are seen along the cervical spine. Underlying facet disease is noted. Prevertebral soft tissues are within normal limits. The thyroid gland is unremarkable in appearance. The visualized lung apices are clear. Mild calcification is noted at the carotid bifurcations bilaterally. IMPRESSION: 1. No evidence of traumatic intracranial injury or fracture. 2. No evidence of fracture or subluxation along the cervical spine. 3. Moderate cortical volume loss noted. 4. Mild degenerative change along the cervical spine. 5. Mild calcification at the carotid bifurcations bilaterally. Carotid ultrasound would be helpful for further evaluation, when and as deemed clinically appropriate. Electronically Signed   By: Garald Balding M.D.   On: 08/28/2015 06:51   Ct Cervical Spine Wo Contrast  08/21/2015  CLINICAL DATA:  Fall with occipital scalp laceration. Initial encounter. EXAM: CT HEAD WITHOUT CONTRAST CT CERVICAL SPINE WITHOUT CONTRAST TECHNIQUE: Multidetector CT imaging of the head and cervical spine was performed following the standard protocol without intravenous contrast. Multiplanar CT image reconstructions of the cervical spine were also generated. COMPARISON:  06/23/2015 head CT.  03/10/2015 cervical spine CT FINDINGS: CT HEAD FINDINGS Brain: No evidence of acute infarction, hemorrhage, hydrocephalus, or mass lesion/mass effect. Moderate generalized atrophy. Vascular: No hyperdense vessel or unexpected calcification. Skull: High right parietal scalp laceration with skin staples. Negative for calvarial fracture. Sinuses/Orbits: No acute finding. CT CERVICAL SPINE  FINDINGS Alignment: No traumatic malalignment. C5-6, C7-T1, and T1-T2 anterolisthesis is mild and facet mediated. Skull base and vertebrae: No  acute fracture. No aggressive process. Soft tissues and canal: No prevertebral fluid. No gross canal hematoma. Degenerative: Diffuse facet arthropathy. Lower cervical and upper thoracic spondylosis and disc narrowing. Extensive chondrocalcinosis of discs and posterior ligaments. Probable associated erosions in the odontoid tip suggesting a degree of CPPD arthropathy. IMPRESSION: 1. No evidence of acute intracranial or cervical spine injury. 2. Scalp laceration without calvarial fracture Electronically Signed   By: Monte Fantasia M.D.   On: 08/21/2015 07:09   Dg Chest Portable 1 View  08/21/2015  CLINICAL DATA:  Unwitnessed fall tonight EXAM: PORTABLE CHEST 1 VIEW COMPARISON:  01/14/2014 FINDINGS: A single AP portable view of the chest demonstrates no focal airspace consolidation or alveolar edema. The lungs are grossly clear. There is no large effusion or pneumothorax. Cardiac and mediastinal contours appear unremarkable. IMPRESSION: No active disease. Electronically Signed   By: Andreas Newport M.D.   On: 08/21/2015 06:16     Assessment and Plan  82 y.o. male   1. Sinus bradycardia Long history of bradycardia, notes from cardiology dating back to September 2011 Blood pressure typically 130s to 150s, no evidence of hypotension Bradycardia at rest in bed and while sleeping Likely of limited clinical significance  --- would recommend monitoring her heart rate when siting  up, standing up, amblating Notes indicating he is combative, difficult to manage  even if he did require a pacemaker, which at this time is not clear, this would be  extremely difficult -----Will discuss case with Dr. Caryl Comes, EP on rounds tomorrow  2. Tachycardia/arrhythmia Two nights ago, he had a short rhythm noted on telemetry, very rapid wide complex rhythm, Unable to exclude VT,   There was suggestion of VT while having ureter stent removed in 2011 no telemetry strips were not available at that time for review --No further arrhythmia noted in past 24 hours No change in medical management at this time, will continue to monitor telemetry  3) severe dementia Previously seen by psychiatry Periods of behavioral disturbanc Nursing report very agitated, requiring a sitter  4) weakness Long history of falls and weakness dating back several years, notes back in 2011 reporting falls at that time from leg weakness Ideally should not be able to ambulate without assistance or monitoring Should be using walker at all times  5) history of DVT, PE Bilateral PE October 2010, IVC filter in place  Long discussion with nursing concerning his arrhythmia Detailed review of telemetry Total encounter time more than 35 minutes Greater than 50% was spent in counseling and coordination of care with the patient  Signed, Esmond Plants, MD, Ph.D. [facility] 08/29/2015, 10:27 AM

## 2015-08-29 NOTE — Progress Notes (Signed)
Ryan DR  Rockey Situ 's PA was informed about pt having frequent pauses and junctional escape beat with HR between 30's and 40's

## 2015-08-29 NOTE — Clinical Social Work Note (Signed)
Clinical Social Work Assessment  Patient Details  Name: Cody Gordon. MRN: [id number] Date of Birth: 30-Mar-1920  Date of referral:  08/29/15               Reason for consult:  Discharge Planning                Permission sought to Gordon information with:  Family Supports Permission granted to Gordon information::  Yes, Verbal Permission Granted  Name::        Agency::     Relationship::   Cody Gordon- [place]- Niece )  Contact Information:     Housing/Transportation Living arrangements for the past 2 months:  [place] [employer]) Source of Information:  Other (Comment Required) Patient Interpreter Needed:  None Criminal Activity/Legal Involvement Pertinent to Current Situation/Hospitalization:  No - Comment as needed Significant Relationships:  Other Family Members Lives with:  Facility Resident [facility]) Do you feel safe going back to the place where you live?  Yes Need for family participation in patient care:  Yes (Comment) ([name]- Niece )  Care giving concerns:  Patient is a resident at [place].    Social Worker assessment / plan:  CSW met with patient's Niece- Cody Gordon and Nephew/ POA- Cody Gordon at bedside. CSW introduced herself and her role. Per Cody Gordon patient is a resident at [facility]. Reported that he was told by MD that patient may need SNF level of care at discharge but discussed with [name] at [facility] and they will be able to accept patient back with Hospice services. Stated that he was not interested in changing patient's setting if he did not have to. Reported that Cody Gordon informed him that they can add bed alarms and chair alarms for patient as well as work with Hospice services. Stated they feel that's the best option for patient. Granted CSW verbal permission to speak to [name] at [facility]. CSW informed [name] about the family's hospice request. CSW will continue to  follow and assist.  Employment status:  Retired Insurance underwriter information:  Medicare PT Recommendations:  Not assessed at this time Information / Referral to community resources:     Patient/Family's Response to care:  Patient's family is in agreement for patient to return to [facility].   Patient/Family's Understanding of and Emotional Response to Diagnosis, Current Treatment, and Prognosis:  Patient's family understands his prognosis and diagnosis and is appreciates CSW's assistance.   Emotional Assessment Appearance:  Appears stated age Attitude/Demeanor/Rapport:   (None) Affect (typically observed):  Calm Orientation:  Oriented to Self Alcohol / Substance use:  Not Applicable Psych involvement (Current and /or in the community):  No (Comment)  Discharge Needs  Concerns to be addressed:  Discharge Planning Concerns Readmission within the last 30 days:  No Current discharge risk:  Chronically ill Barriers to Discharge:  Continued Medical Work up   [employer], LCSW 08/29/2015, 3:51 PM

## 2015-08-29 NOTE — Progress Notes (Signed)
[place] [facility] [place] NAME: Cody Gordon    MR#:  [id number]  DATE OF BIRTH:  12/08/1920  SUBJECTIVE:  CHIEF COMPLAINT:   Chief Complaint  Patient presents with  . Fall   Patient was agitated earlier and now sedated with medications. Has had episodes of bradycardia in the 20s.  REVIEW OF SYSTEMS:    Review of Systems  Unable to perform ROS: dementia    DRUG ALLERGIES:  No Known Allergies  VITALS:  Blood pressure 137/73, pulse 37, temperature 98.8 F (37.1 C), temperature source Oral, resp. rate 20, height 5\' 8"  (1.727 m), weight 71.351 kg (157 lb 4.8 oz), SpO2 99 %.  PHYSICAL EXAMINATION:   Physical Exam  GENERAL:  82 y.o.-year-old patient lying in the bed EYES: Pupils equal, round, reactive to light. HEENT: Head atraumatic, normocephalic. Oropharynx and nasopharynx clear. NECK:  Supple, no jugular venous distention. No thyroid enlargement, no tenderness. LUNGS: Normal breath sounds bilaterally, no wheezing, rales, rhonchi. No use of accessory muscles of respiration.  CARDIOVASCULAR: S1, S2 normal. No murmurs, rubs, or gallops. ABDOMEN: Soft, nontender, nondistended. Bowel sounds present. No organomegaly or mass EXTREMITIES: No cyanosis, clubbing or edema b/l NEUROLOGIC: Moves all 4 extremities PSYCHIATRIC: The patient is drowzy.  LABORATORY PANEL:   CBC  Recent Labs Lab 08/28/15 0508  WBC 5.5  HGB 11.5*  HCT 34.7*  PLT 198   ------------------------------------------------------------------------------------------------------------------ Chemistries   Recent Labs Lab 08/28/15 0508 08/29/15 0826  NA 142  --   K 3.4* 3.5  CL 110  --   CO2 25  --   GLUCOSE 99  --   BUN 15  --   CREATININE 0.90  --   CALCIUM 9.4  --   MG  --  1.5*  AST 34  --   ALT 22  --   ALKPHOS 137*  --   BILITOT 0.7  --     ------------------------------------------------------------------------------------------------------------------  Cardiac Enzymes  Recent Labs Lab 08/28/15 0508  TROPONINI 0.03*   ------------------------------------------------------------------------------------------------------------------  RADIOLOGY:  Ct Head Wo Contrast  08/28/2015  CLINICAL DATA:  Status post unwitnessed fall, with knot at the back of the head. Confusion. Concern for head or cervical spine injury. Initial encounter. EXAM: CT HEAD WITHOUT CONTRAST CT CERVICAL SPINE WITHOUT CONTRAST TECHNIQUE: Multidetector CT imaging of the head and cervical spine was performed following the standard protocol without intravenous contrast. Multiplanar CT image reconstructions of the cervical spine were also generated. COMPARISON:  CT of the head and cervical spine performed 08/21/2015 FINDINGS: CT HEAD FINDINGS There is no evidence of acute infarction, mass lesion, or intra- or extra-axial hemorrhage on CT. Prominence of ventricles and sulci reflects moderate cortical volume loss. Cerebellar atrophy is noted. The brainstem and fourth ventricle are within normal limits. The basal ganglia are unremarkable in appearance. The cerebral hemispheres demonstrate grossly normal gray-white differentiation. No mass effect or midline shift is seen. There is no evidence of fracture; visualized osseous structures are unremarkable in appearance. The orbits are within normal limits. The paranasal sinuses and mastoid air cells are well-aerated. No significant soft tissue abnormalities are seen. CT CERVICAL SPINE FINDINGS There is no evidence of fracture or subluxation. There is minimal grade 1 anterolisthesis of C5 on C6, of C6 on C7, of C7 on T1, and of T1 on T2. Scattered anterior and posterior disc osteophyte complexes are seen along the cervical spine. Underlying facet disease is noted. Prevertebral soft tissues are within normal limits. The thyroid  gland  is unremarkable in appearance. The visualized lung apices are clear. Mild calcification is noted at the carotid bifurcations bilaterally. IMPRESSION: 1. No evidence of traumatic intracranial injury or fracture. 2. No evidence of fracture or subluxation along the cervical spine. 3. Moderate cortical volume loss noted. 4. Mild degenerative change along the cervical spine. 5. Mild calcification at the carotid bifurcations bilaterally. Carotid ultrasound would be helpful for further evaluation, when and as deemed clinically appropriate. Electronically Signed   By: Garald Balding M.D.   On: 08/28/2015 06:51   Ct Cervical Spine Wo Contrast  08/28/2015  CLINICAL DATA:  Status post unwitnessed fall, with knot at the back of the head. Confusion. Concern for head or cervical spine injury. Initial encounter. EXAM: CT HEAD WITHOUT CONTRAST CT CERVICAL SPINE WITHOUT CONTRAST TECHNIQUE: Multidetector CT imaging of the head and cervical spine was performed following the standard protocol without intravenous contrast. Multiplanar CT image reconstructions of the cervical spine were also generated. COMPARISON:  CT of the head and cervical spine performed 08/21/2015 FINDINGS: CT HEAD FINDINGS There is no evidence of acute infarction, mass lesion, or intra- or extra-axial hemorrhage on CT. Prominence of ventricles and sulci reflects moderate cortical volume loss. Cerebellar atrophy is noted. The brainstem and fourth ventricle are within normal limits. The basal ganglia are unremarkable in appearance. The cerebral hemispheres demonstrate grossly normal gray-white differentiation. No mass effect or midline shift is seen. There is no evidence of fracture; visualized osseous structures are unremarkable in appearance. The orbits are within normal limits. The paranasal sinuses and mastoid air cells are well-aerated. No significant soft tissue abnormalities are seen. CT CERVICAL SPINE FINDINGS There is no evidence of fracture or  subluxation. There is minimal grade 1 anterolisthesis of C5 on C6, of C6 on C7, of C7 on T1, and of T1 on T2. Scattered anterior and posterior disc osteophyte complexes are seen along the cervical spine. Underlying facet disease is noted. Prevertebral soft tissues are within normal limits. The thyroid gland is unremarkable in appearance. The visualized lung apices are clear. Mild calcification is noted at the carotid bifurcations bilaterally. IMPRESSION: 1. No evidence of traumatic intracranial injury or fracture. 2. No evidence of fracture or subluxation along the cervical spine. 3. Moderate cortical volume loss noted. 4. Mild degenerative change along the cervical spine. 5. Mild calcification at the carotid bifurcations bilaterally. Carotid ultrasound would be helpful for further evaluation, when and as deemed clinically appropriate. Electronically Signed   By: Garald Balding M.D.   On: 08/28/2015 06:51     ASSESSMENT AND PLAN:   * Symptomatic bradycardia with syncopal episodes Patient has had recurrent admissions for the same problem. Aricept was held last time. Will need pacemaker Consulted cardiology. Monitor on Telemetry Fall precautions Waiting for EP Dr. Caryl Comes  * Hypomagnesemia Replace through IV  * Dementia Watch for inpatient delirium  * History of DVT in 2010 Held Eliquis due to Recurrent falls.  * DVT prophylaxis with Lovenox  All the records are reviewed and case discussed with Care Management/Social Workerr. Management plans discussed with the patient, family and they are in agreement.  CODE STATUS: DNR  Consult palliative care for goals of care  DVT Prophylaxis: SCDs  TOTAL TIME TAKING CARE OF THIS PATIENT: 30 minutes.   POSSIBLE D/C IN 1-2 DAYS, DEPENDING ON CLINICAL CONDITION.  Hillary Bow R M.D on 08/29/2015 at 1:56 PM  Between 7am to 6pm - Pager - [phone]  After 6pm go to [email or web address] - [facility]  Office   [phone]  CC: Primary care physician; Pcp Not In System  Note: This dictation was prepared with Dragon dictation along with smaller phrase technology. Any transcriptional errors that result from this process are unintentional.

## 2015-08-29 NOTE — Care Management (Signed)
Presents from the [employer] alf with sick sinus syndrome. Hx dementia. Here for pacemaker placement. CSW updated. Spoke with [facility].  And confirmed patient lives there. He has no current home health services but they are requesting PT referral. He uses a walker at baseline, feeds himself and staff assists with adl's.

## 2015-08-30 DIAGNOSIS — Z515 Encounter for palliative care: Secondary | ICD-10-CM | POA: Insufficient documentation

## 2015-08-30 DIAGNOSIS — R001 Bradycardia, unspecified: Secondary | ICD-10-CM

## 2015-08-30 DIAGNOSIS — F0390 Unspecified dementia without behavioral disturbance: Secondary | ICD-10-CM

## 2015-08-30 DIAGNOSIS — Z66 Do not resuscitate: Secondary | ICD-10-CM | POA: Insufficient documentation

## 2015-08-30 MED ORDER — DIVALPROEX SODIUM 125 MG PO CSDR
250.0000 mg | DELAYED_RELEASE_CAPSULE | Freq: Every day | ORAL | Status: DC
  Administered 2015-08-30: 250 mg via ORAL
  Filled 2015-08-30: qty 2

## 2015-08-30 NOTE — Progress Notes (Addendum)
CSW informed [name] at [employer] that patient will possibly discharge tomorrow 7/7 with Hospice. Per [name] they prefer [place]. Family is in agreement. CSW informed [place] Liaison.  Informed [name]. CSW will continue to follow and assist.  Ernest Pine, MSW, [place], [place] Social Worker [phone]

## 2015-08-30 NOTE — Consult Note (Signed)
Consultation Note Date: 08/30/2015   Patient Name: Cody Gordon.  DOB: 07-27-1920  MRN: [id number]  Age / Sex: 82 y.o., male  PCP: Pcp Not In System Referring Physician: Hillary Bow, MD  Reason for Consultation: Establishing goals of care  HPI/Patient Profile: 82 y.o. male  admitted on 08/28/2015 with  a known history of sick sinus syndrome, dementia, and DVT presents to the emergency room due to fall.   Patient has had recurrent admissions for falls, syncope with bradycardia. During his last admission his Aricept was stopped. Patient had heart rate dipping into the 20s in the emergency room. Patient  admitted for consideration of pacemaker placement.  Per EMR previous attempts to evaluate his bradycardia/sick sinus syndrome as an outpatient have been unsuccessful. Due to behavioral disturbance, he was unable to wear a 30 day monitor and this was discontinued after discussion with his family. (He kept pulling off the leads)  Per cardiology note this admission: (No strong indication for pacemaker at this time as bradycardia typically noted when supine and at rest, maintaining adequate perfusion pressure -Seen by Dr. Caryl Comes, EP on rounds, no strong indication for pacemaker at this time)   Per family, niece/HPOA/ Virgel Manifold patient has has significant behavior issues associated with dementia and continued physical, functional and cognitive decline over the past many months.    After discussion with family the main goal of care is comfort, quality and dignity.       Clinical Assessment and Goals of Care:    This NP Wadie Lessen reviewed medical records, received report from team, assessed the patient and then meet spoke by telephone with niece/HPOA/ Reatha Armour and nephew/ [facility] with  to discuss diagnosis, prognosis, GOC, EOL wishes disposition and options.   A detailed discussion was  had today regarding advanced directives.  Concepts specific to code status, artifical feeding and hydration, continued IV antibiotics and rehospitalization was had.  The difference between a aggressive medical intervention path  and a palliative comfort care path for this patient at this time was had.  Values and goals of care important to patient and family were attempted to be elicited.  Concept of Hospice and Palliative Care were discussed  Natural trajectory and expectations at EOL were discussed.  Questions and concerns addressed. Family encouraged to call with questions or concerns.  PMT will continue to support holistically.  As a family they have decided that they do not wish for any further diagnostics or life prolonging intervetnions.  They do not desire futher assessment for pacemaker placement.  They hope for return to facility/ AL and to focus on comfort, quality and dignity.  They are hopeful that hospice benefit can be put in place   HCPOA    SUMMARY OF RECOMMENDATIONS    Code Status/Advance Care Planning:  DNR   Symptom Management:   Agitation: Add Depakote sprinkles 250 mg qhs  Palliative Prophylaxis:   Aspiration, Bowel Regimen, Frequent Pain Assessment and Oral Care  Additional Recommendations (Limitations, Scope, Preferences):  Full  Comfort Care  Psycho-social/Spiritual:   Desire for further Chaplaincy support:no  Additional Recommendations: Education on Hospice  Prognosis:   With a shift to full comfort, no further diagnostics, symptom management and  avoid rehospitalization,  prognosis is likely:   < 6 months   Per HPOA his previous facility welcomes him back with hospice services.  They plan to place bed/chair alarms to reduce fall risk.     Discharge Planning: Complete MOST form with family once discharged to facility.  Home with Hospice     Primary Diagnoses: Present on Admission:  . Bradycardia  I have reviewed the medical record,  interviewed the patient and family, and examined the patient. The following aspects are pertinent.  Past Medical History  Diagnosis Date  . Neoplasm, bladder   . Prostate cancer ([place])   . Sinus bradycardia   . Nephrolithiasis   . Incomplete emptying of bladder   . Hematuria   . Dementia   . Anemia   . DVT (deep venous thrombosis) ([facility])     PE also  . 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 Diverticulosis of intestine with bleeding    Social History   Social History  . Marital Status: Married    Spouse Name: N/A  . Number of Children: N/A  . Years of Education: N/A   Occupational History  . retired    Social History Main Topics  . Smoking status: Never Smoker   . Smokeless tobacco: None     Comment: tobacco use -no  . Alcohol Use: No  . Drug Use: No  . Sexual Activity: Not Asked   Other Topics Concern  . None   Social History Narrative   Married, retired, does not get regular exercise.    Family History  Problem Relation Age of Onset  . Hypertension Father     deceased   Scheduled Meds: . cephALEXin  500 mg Oral Q12H  . docusate sodium  100 mg Oral BID  . enoxaparin (LOVENOX) injection  40 mg Subcutaneous Q24H  . mirtazapine  15 mg Oral QHS  . pantoprazole  40 mg Oral Daily  . potassium chloride  10 mEq Oral Daily  . QUEtiapine  50 mg Oral BID  . sodium chloride flush  3 mL Intravenous Q12H   Continuous Infusions:  PRN Meds:.acetaminophen **OR** acetaminophen, albuterol, atropine, bisacodyl, HYDROcodone-acetaminophen, loperamide, LORazepam, magnesium hydroxide, ondansetron **OR** ondansetron (ZOFRAN) IV, polyethylene glycol Medications Prior to Admission:  Prior to Admission medications   Medication Sig Start Date End Date Taking? Authorizing Provider  acetaminophen (TYLENOL) 325 MG tablet Take 650 mg by mouth every 4 (four) hours as needed for mild pain, fever or headache.    Yes Historical Provider, MD  acetaminophen (TYLENOL) 500 MG  tablet Take 500 mg by mouth every 4 (four) hours as needed for mild pain.    Yes Historical Provider, MD  alum & mag hydroxide-simeth (MAALOX PLUS) 400-400-40 MG/5ML suspension Take 15 mLs by mouth every 6 (six) hours as needed for indigestion.   Yes Historical Provider, MD  bisacodyl (BISACODYL) 5 MG EC tablet Take 10 mg by mouth daily as needed for moderate constipation.   Yes Historical Provider, MD  donepezil (ARICEPT) 5 MG tablet Take 5 mg by mouth at bedtime.   Yes Historical Provider, MD  guaifenesin (ROBITUSSIN) 100 MG/5ML syrup Take 10 mLs by mouth 3 (three) times daily as needed for cough.   Yes Historical Provider, MD  loperamide (IMODIUM A-D) 2  MG tablet Take 2 mg by mouth as needed for diarrhea or loose stools.   Yes Historical Provider, MD  magnesium hydroxide (MILK OF MAGNESIA) 400 MG/5ML suspension Take 30 mLs by mouth daily as needed for mild constipation.   Yes Historical Provider, MD  mirtazapine (REMERON) 15 MG tablet Take 15 mg by mouth at bedtime.   Yes Historical Provider, MD  neomycin-bacitracin-polymyxin (NEOSPORIN) [phone] ointment Apply 1 application topically as needed (wound care). Pt applies to right upper arm.   Yes Historical Provider, MD  omeprazole (PRILOSEC) 20 MG capsule Take 20 mg by mouth daily.   Yes Historical Provider, MD  potassium chloride (K-DUR,KLOR-CON) 10 MEQ tablet Take 10 mEq by mouth daily.   Yes Historical Provider, MD  Probiotic Product ([place]) Take 1 capsule by mouth 2 (two) times daily.   Yes Historical Provider, MD  QUEtiapine (SEROQUEL) 50 MG tablet Take 50 mg by mouth 2 (two) times daily.   Yes Historical Provider, MD  Vitamin D, Ergocalciferol, (DRISDOL) 50000 units CAPS capsule Take 50,000 Units by mouth once a week. Pt takes on Tuesday.   Yes Historical Provider, MD   No Known Allergies Review of Systems  Unable to perform ROS: Dementia    Physical Exam  Constitutional: He appears well-developed. He appears ill.    - confused and unable to follow simple commands  Cardiovascular: Normal rate, regular rhythm and normal heart sounds.   Pulmonary/Chest: He has decreased breath sounds in the right lower field and the left lower field.  Skin: Skin is warm and dry.    Vital Signs: BP 131/69 mmHg  Pulse 66  Temp(Src) 97.9 F (36.6 C) (Oral)  Resp 20  Ht 5\' 8"  (1.727 m)  Wt 67.132 kg (148 lb)  BMI 22.51 kg/m2  SpO2 69% Pain Assessment: No/denies pain   Pain Score: 0-No pain   SpO2: SpO2: (!) 69 % O2 Device:SpO2: (!) 69 % O2 Flow Rate: .   IO: Intake/output summary:  Intake/Output Summary (Last 24 hours) at 08/30/15 1039 Last data filed at 08/30/15 0941  Gross per 24 hour  Intake    120 ml  Output    800 ml  Net   -680 ml    LBM: Last BM Date: 08/28/15 Baseline Weight: Weight: 68.04 kg (150 lb) Most recent weight: Weight: 67.132 kg (148 lb)     Palliative Assessment/Data: 30 % at best   Flowsheet Rows        Most Recent Value   Intake Tab    Referral Department  Hospitalist   Unit at Time of Referral  Cardiac/Telemetry Unit   Palliative Care Primary Diagnosis  Neurology   Date Notified  08/29/15   Palliative Care Type  New Palliative care   Reason for referral  Clarify Goals of Care   Date of Admission  08/28/15   # of days IP prior to Palliative referral  1   Clinical Assessment    Psychosocial & Spiritual Assessment    Palliative Care Outcomes      Discussed with Dr Darvin Neighbours and Flo Shanks RN Hospice liason  Time In: 1010 Time Out: 1130 Time Total: 80 min Greater than 50%  of this time was spent counseling and coordinating care related to the above assessment and plan.  Signed by: Wadie Lessen, NP   Please contact Palliative Medicine Team phone at [phone] for questions and concerns.  For individual provider: See Shea Evans

## 2015-08-30 NOTE — Evaluation (Signed)
Physical Therapy Evaluation Patient Details Name: Cody Gordon. MRN: [id number] DOB: [birth year] Today's Date: 08/30/2015   History of Present Illness  Pt is a 82 y.o. male presenting to hospital s/p unwitnessed fall 08/28/15 (of note, pt with recent fall 08/21/15 with head laceration and ED visit).  Pt found to be bradycardic with HR in high 20's (29-32 bpm) but rebounds into 60's.  Pt admitted with symptomatic bradycardia with syncopal episodes and possible pacemaker work-up.  PMH includes prostate CA, sinus bradycardia, dementia, anemia, DVT, PE, IVC filter.  Clinical Impression  Pt unable to clearly verbalize PLOF and home set-up when asked but per chart pt lives at a facility and uses a walker for mobility.  Currently pt is min assist supine to sit, min to mod assist to stand with RW, and min to mod assist x1 (plus close SBA plus close chair follow for safety) to ambulate pt 15 feet with RW.  Pt's HR 117 bpm at rest upon PT entering room and increased to 140 bpm with ambulation (distance limited with ambulation d/t elevated HR and pt fatigue); nursing present for all functional mobility during session and aware of HR.  Pt would benefit from skilled PT to address noted impairments and functional limitations.  Pt appears appropriate for skilled PT at STR.     Follow Up Recommendations SNF    Equipment Recommendations  Rolling walker with 5" wheels    Recommendations for Other Services       Precautions / Restrictions Precautions Precautions: Fall Precaution Comments: Monitor HR Restrictions Weight Bearing Restrictions: No      Mobility  Bed Mobility Overal bed mobility: Needs Assistance Bed Mobility: Supine to Sit     Supine to sit: Min assist;HOB elevated     General bed mobility comments: assist for trunk; vc's for technique and safety required  Transfers Overall transfer level: Needs assistance Equipment used: Rolling walker (2 wheeled) Transfers: Sit to/from  Stand Sit to Stand: Min assist;Mod assist         General transfer comment: assist to initiate stand and come to full stand; assist for safety  Ambulation/Gait Ambulation/Gait assistance: Min assist;Mod assist;+2 safety/equipment (1 physical assist plus 1 close SBA plus close chair follow) Ambulation Distance (Feet): 15 Feet Assistive device: Rolling walker (2 wheeled)   Gait velocity: decreased   General Gait Details: decreased B step length/foot clearance/heelstrike; intermittent vc's required for upright posture (increased flexed posture noted with fatigue); assist to keep RW close to pt required d/t pt pushing RW too far in front of pt  Stairs            Wheelchair Mobility    Modified Rankin (Stroke Patients Only)       Balance Overall balance assessment: Needs assistance Sitting-balance support: Bilateral upper extremity supported;Feet supported Sitting balance-Leahy Scale: Fair     Standing balance support: Bilateral upper extremity supported (on RW) Standing balance-Leahy Scale: Poor Standing balance comment: initial posterior lean requiring assist to shift weight forward                             Pertinent Vitals/Pain Pain Assessment: No/denies pain    Home Living Family/patient expects to be discharged to:: Skilled nursing facility                 Additional Comments: Per chart pt lives at [facility].    Prior Function Level of Independence: Needs assistance   Gait /  Transfers Assistance Needed: Per chart, pt uses walker for ambulation.  ADL's / Homemaking Assistance Needed: Per chart, staff assist with ADL's but pt feeds himself.        Hand Dominance        Extremity/Trunk Assessment   Upper Extremity Assessment: Generalized weakness;Difficult to assess due to impaired cognition           Lower Extremity Assessment: Generalized weakness;Difficult to assess due to impaired cognition         Communication    Communication:  (Difficulty to understand words but pt very talkative during session)  Cognition Arousal/Alertness: Awake/alert Behavior During Therapy: Impulsive;Restless Overall Cognitive Status: No family/caregiver present to determine baseline cognitive functioning (Pt oriented to person but not place, time, or situation.)       Memory: Decreased recall of precautions              General Comments General comments (skin integrity, edema, etc.): Pt laying in bed with sitter present and pt trying to swing legs over bedrails to get up.    Exercises        Assessment/Plan    PT Assessment Patient needs continued PT services  PT Diagnosis Difficulty walking;Generalized weakness   PT Problem List Decreased strength;Decreased activity tolerance;Decreased balance;Decreased mobility;Decreased knowledge of use of DME;Decreased safety awareness;Cardiopulmonary status limiting activity  PT Treatment Interventions DME instruction;Gait training;Functional mobility training;Therapeutic activities;Therapeutic exercise;Balance training;Patient/family education   PT Goals (Current goals can be found in the Care Plan section) Acute Rehab PT Goals Patient Stated Goal: to get up out of bed and walk PT Goal Formulation: With patient Time For Goal Achievement: 09/13/15 Potential to Achieve Goals: Good    Frequency Min 2X/week   Barriers to discharge  (Possibly level of assist)      Co-evaluation               End of Session Equipment Utilized During Treatment: Gait belt Activity Tolerance: Patient limited by fatigue Patient left: in chair;with call bell/phone within reach;with nursing/sitter in room [employer] present and reported pt did not need chair pad alarm d/t sitter being present) Nurse Communication: Mobility status;Precautions         Time: [id number] PT Time Calculation (min) (ACUTE ONLY): 16 min   Charges:   PT Evaluation $PT Eval Low Complexity: 1 Procedure      PT G CodesLeitha Bleak 09/23/15, 1:59 PM Leitha Bleak, [place]

## 2015-08-30 NOTE — Progress Notes (Signed)
[place] [facility] [place] NAME: Cody Gordon    MR#:  [id number]  DATE OF BIRTH:  Jul 22, 1920  SUBJECTIVE:  CHIEF COMPLAINT:   Chief Complaint  Patient presents with  . Fall   Patient was agitated earlier and now sedated with medications. Has had episodes of bradycardia in the 20s.  REVIEW OF SYSTEMS:   Review of Systems  Unable to perform ROS: dementia   DRUG ALLERGIES:  No Known Allergies  VITALS:  Blood pressure 132/83, pulse 80, temperature 98.4 F (36.9 C), temperature source Oral, resp. rate 19, height 5\' 8"  (1.727 m), weight 67.132 kg (148 lb), SpO2 94 %.  PHYSICAL EXAMINATION:   Physical Exam  GENERAL:  82 y.o.-year-old patient lying in the bed EYES: Pupils equal, round, reactive to light. HEENT: Head atraumatic, normocephalic. Oropharynx and nasopharynx clear. NECK:  Supple, no jugular venous distention. No thyroid enlargement, no tenderness. LUNGS: Normal breath sounds bilaterally, no wheezing, rales, rhonchi. No use of accessory muscles of respiration.  CARDIOVASCULAR: S1, S2 normal. No murmurs, rubs, or gallops. ABDOMEN: Soft, nontender, nondistended. Bowel sounds present. No organomegaly or mass EXTREMITIES: No cyanosis, clubbing or edema b/l NEUROLOGIC: Moves all 4 extremities PSYCHIATRIC: The patient is drowzy.  LABORATORY PANEL:   CBC  Recent Labs Lab 08/28/15 0508  WBC 5.5  HGB 11.5*  HCT 34.7*  PLT 198   ------------------------------------------------------------------------------------------------------------------ Chemistries   Recent Labs Lab 08/28/15 0508 08/29/15 0826  NA 142  --   K 3.4* 3.5  CL 110  --   CO2 25  --   GLUCOSE 99  --   BUN 15  --   CREATININE 0.90  --   CALCIUM 9.4  --   MG  --  1.5*  AST 34  --   ALT 22  --   ALKPHOS 137*  --   BILITOT 0.7  --    ----------------------------------------------------------------------------------------------------------------- Cardiac Enzymes  Recent Labs Lab 08/28/15 0508  TROPONINI 0.03*   ------------------------------------------------------------------------------------------------------------------ RADIOLOGY:  No results found.  ASSESSMENT AND PLAN:   * Symptomatic bradycardia with syncopal episodes Unclear if bradycardia is causing the falls. Patient has had recurrent admissions for the same problem. Aricept was held last time. Consulted cardiology. Pacemaker deferred Fall precautions Seen by Ep Dr. Caryl Comes.  * Hypomagnesemia Replaced through IV  * Dementia Watch for inpatient delirium  * History of DVT in 2010 Held Eliquis due to Recurrent falls.  * DVT prophylaxis with Lovenox  Discussed with palliative care. Patient has had progressive decline over the past few months. This point plan is to discharge patient back to assisted living facility with hospice services  Has been agitated overnight and drowsy through the day. Likely discharge tomorrow.  All the records are reviewed and case discussed with Care Management/Social Workerr. Management plans discussed with the patient, family and they are in agreement.  CODE STATUS: DNR   DVT Prophylaxis: SCDs  TOTAL TIME TAKING CARE OF THIS PATIENT: 30 minutes.   POSSIBLE D/C IN 1-2 DAYS, DEPENDING ON CLINICAL CONDITION.  Hillary Bow R M.D on 08/30/2015 at 1:32 PM  Between 7am to 6pm - Pager - [phone]  After 6pm go to [email or web address] - password [email or web address]  Office  [phone]  CC: Primary care physician; Pcp Not In System  Note: This dictation was prepared with Dragon dictation along with smaller phrase technology. Any transcriptional errors that result from this process are unintentional.

## 2015-08-30 NOTE — Care Management Important Message (Signed)
Important Message  Patient Details  Name: Cody Gordon. MRN: [id number] Date of Birth: 05-Jul-1920   Medicare Important Message Given:  Yes    Juliann Pulse A Natsuko Kelsay 08/30/2015, 3:07 PM

## 2015-08-30 NOTE — Progress Notes (Signed)
Patient: Cody Gordon. / Admit Date: 08/28/2015 / Date of Encounter: 08/30/2015, 2:28 PM   Subjective: Patient sitting up in chair, comfortable, no complaints Heart rate in the 60s while sitting Denies any lightheadedness or dizziness He does not ambulate, requires assistance for transfer Sitter present 24 hours per day given agitation  Review of Systems: Review of Systems  Unable to perform ROS Secondary to dementia  Objective: Telemetry: Sinus bradycardia rates from 30 up to 70s Physical Exam: Blood pressure 132/83, pulse 60, temperature 98.4 F (36.9 C), temperature source Oral, resp. rate 19, height 5\' 8"  (1.727 m), weight 148 lb (67.132 kg), SpO2 97 %. Body mass index is 22.51 kg/(m^2). General:  in no acute distress, sitting in chair Head: Normocephalic, atraumatic Neck: Negative for carotid bruits. JVD could not be examined though grossly normal Lungs: Clear bilaterally to auscultation without wheezes, rales, or rhonchi. Breathing is unlabored. Heart: RRR with S1 S2, bradycardia No murmurs, rubs, or gallops appreciated. Abdomen: Soft, non-tender, non-distended with normoactive bowel sounds. No hepatomegaly. No rebound/guarding. No obvious abdominal masses. Msk: Strength and tone appear normal for age. Extremities: No clubbing or cyanosis. No edema. Distal pedal pulses are 2+ and equal bilaterally. Neuro: Sleeping this morning, No facial asymmetry. No focal deficit. Moves all extremities spontaneously. Psych: Responds to questions, not always appropriate   Intake/Output Summary (Last 24 hours) at 08/30/15 1428 Last data filed at 08/30/15 1300  Gross per 24 hour  Intake    270 ml  Output    800 ml  Net   -530 ml    Inpatient Medications:  . cephALEXin  500 mg Oral Q12H  . divalproex  250 mg Oral QHS  . docusate sodium  100 mg Oral BID  . enoxaparin (LOVENOX) injection  40 mg Subcutaneous Q24H  . mirtazapine  15 mg Oral QHS  . pantoprazole  40 mg Oral  Daily  . potassium chloride  10 mEq Oral Daily  . QUEtiapine  50 mg Oral BID  . sodium chloride flush  3 mL Intravenous Q12H   Infusions:    Labs:  Recent Labs  08/28/15 0508 08/29/15 0826  NA 142  --   K 3.4* 3.5  CL 110  --   CO2 25  --   GLUCOSE 99  --   BUN 15  --   CREATININE 0.90  --   CALCIUM 9.4  --   MG  --  1.5*    Recent Labs  08/28/15 0508  AST 34  ALT 22  ALKPHOS 137*  BILITOT 0.7  PROT 6.6  ALBUMIN 3.7    Recent Labs  08/28/15 0508  WBC 5.5  HGB 11.5*  HCT 34.7*  MCV 80.8  PLT 198    Recent Labs  08/28/15 0508  TROPONINI 0.03*   Invalid input(s): POCBNP No results for input(s): HGBA1C in the last 72 hours.   Weights: Filed Weights   08/28/15 0932 08/29/15 0440 08/30/15 0415  Weight: 157 lb 11.2 oz (71.532 kg) 157 lb 4.8 oz (71.351 kg) 148 lb (67.132 kg)     Radiology/Studies:  Ct Head Wo Contrast  08/28/2015  CLINICAL DATA:  Status post unwitnessed fall, with knot at the back of the head. Confusion. Concern for head or cervical spine injury. Initial encounter. EXAM: CT HEAD WITHOUT CONTRAST CT CERVICAL SPINE WITHOUT CONTRAST TECHNIQUE: Multidetector CT imaging of the head and cervical spine was performed following the standard protocol without intravenous contrast. Multiplanar CT image reconstructions of  the cervical spine were also generated. COMPARISON:  CT of the head and cervical spine performed 08/21/2015 FINDINGS: CT HEAD FINDINGS There is no evidence of acute infarction, mass lesion, or intra- or extra-axial hemorrhage on CT. Prominence of ventricles and sulci reflects moderate cortical volume loss. Cerebellar atrophy is noted. The brainstem and fourth ventricle are within normal limits. The basal ganglia are unremarkable in appearance. The cerebral hemispheres demonstrate grossly normal gray-white differentiation. No mass effect or midline shift is seen. There is no evidence of fracture; visualized osseous structures are unremarkable  in appearance. The orbits are within normal limits. The paranasal sinuses and mastoid air cells are well-aerated. No significant soft tissue abnormalities are seen. CT CERVICAL SPINE FINDINGS There is no evidence of fracture or subluxation. There is minimal grade 1 anterolisthesis of C5 on C6, of C6 on C7, of C7 on T1, and of T1 on T2. Scattered anterior and posterior disc osteophyte complexes are seen along the cervical spine. Underlying facet disease is noted. Prevertebral soft tissues are within normal limits. The thyroid gland is unremarkable in appearance. The visualized lung apices are clear. Mild calcification is noted at the carotid bifurcations bilaterally. IMPRESSION: 1. No evidence of traumatic intracranial injury or fracture. 2. No evidence of fracture or subluxation along the cervical spine. 3. Moderate cortical volume loss noted. 4. Mild degenerative change along the cervical spine. 5. Mild calcification at the carotid bifurcations bilaterally. Carotid ultrasound would be helpful for further evaluation, when and as deemed clinically appropriate. Electronically Signed   By: Garald Balding M.D.   On: 08/28/2015 06:51   Ct Head Wo Contrast  08/21/2015  CLINICAL DATA:  Fall with occipital scalp laceration. Initial encounter. EXAM: CT HEAD WITHOUT CONTRAST CT CERVICAL SPINE WITHOUT CONTRAST TECHNIQUE: Multidetector CT imaging of the head and cervical spine was performed following the standard protocol without intravenous contrast. Multiplanar CT image reconstructions of the cervical spine were also generated. COMPARISON:  06/23/2015 head CT.  03/10/2015 cervical spine CT FINDINGS: CT HEAD FINDINGS Brain: No evidence of acute infarction, hemorrhage, hydrocephalus, or mass lesion/mass effect. Moderate generalized atrophy. Vascular: No hyperdense vessel or unexpected calcification. Skull: High right parietal scalp laceration with skin staples. Negative for calvarial fracture. Sinuses/Orbits: No acute  finding. CT CERVICAL SPINE FINDINGS Alignment: No traumatic malalignment. C5-6, C7-T1, and T1-T2 anterolisthesis is mild and facet mediated. Skull base and vertebrae: No  acute fracture. No aggressive process. Soft tissues and canal: No prevertebral fluid. No gross canal hematoma. Degenerative: Diffuse facet arthropathy. Lower cervical and upper thoracic spondylosis and disc narrowing. Extensive chondrocalcinosis of discs and posterior ligaments. Probable associated erosions in the odontoid tip suggesting a degree of CPPD arthropathy. IMPRESSION: 1. No evidence of acute intracranial or cervical spine injury. 2. Scalp laceration without calvarial fracture Electronically Signed   By: Monte Fantasia M.D.   On: 08/21/2015 07:09   Ct Cervical Spine Wo Contrast  08/28/2015  CLINICAL DATA:  Status post unwitnessed fall, with knot at the back of the head. Confusion. Concern for head or cervical spine injury. Initial encounter. EXAM: CT HEAD WITHOUT CONTRAST CT CERVICAL SPINE WITHOUT CONTRAST TECHNIQUE: Multidetector CT imaging of the head and cervical spine was performed following the standard protocol without intravenous contrast. Multiplanar CT image reconstructions of the cervical spine were also generated. COMPARISON:  CT of the head and cervical spine performed 08/21/2015 FINDINGS: CT HEAD FINDINGS There is no evidence of acute infarction, mass lesion, or intra- or extra-axial hemorrhage on CT. Prominence of ventricles and sulci  reflects moderate cortical volume loss. Cerebellar atrophy is noted. The brainstem and fourth ventricle are within normal limits. The basal ganglia are unremarkable in appearance. The cerebral hemispheres demonstrate grossly normal gray-white differentiation. No mass effect or midline shift is seen. There is no evidence of fracture; visualized osseous structures are unremarkable in appearance. The orbits are within normal limits. The paranasal sinuses and mastoid air cells are well-aerated.  No significant soft tissue abnormalities are seen. CT CERVICAL SPINE FINDINGS There is no evidence of fracture or subluxation. There is minimal grade 1 anterolisthesis of C5 on C6, of C6 on C7, of C7 on T1, and of T1 on T2. Scattered anterior and posterior disc osteophyte complexes are seen along the cervical spine. Underlying facet disease is noted. Prevertebral soft tissues are within normal limits. The thyroid gland is unremarkable in appearance. The visualized lung apices are clear. Mild calcification is noted at the carotid bifurcations bilaterally. IMPRESSION: 1. No evidence of traumatic intracranial injury or fracture. 2. No evidence of fracture or subluxation along the cervical spine. 3. Moderate cortical volume loss noted. 4. Mild degenerative change along the cervical spine. 5. Mild calcification at the carotid bifurcations bilaterally. Carotid ultrasound would be helpful for further evaluation, when and as deemed clinically appropriate. Electronically Signed   By: Garald Balding M.D.   On: 08/28/2015 06:51   Ct Cervical Spine Wo Contrast  08/21/2015  CLINICAL DATA:  Fall with occipital scalp laceration. Initial encounter. EXAM: CT HEAD WITHOUT CONTRAST CT CERVICAL SPINE WITHOUT CONTRAST TECHNIQUE: Multidetector CT imaging of the head and cervical spine was performed following the standard protocol without intravenous contrast. Multiplanar CT image reconstructions of the cervical spine were also generated. COMPARISON:  06/23/2015 head CT.  03/10/2015 cervical spine CT FINDINGS: CT HEAD FINDINGS Brain: No evidence of acute infarction, hemorrhage, hydrocephalus, or mass lesion/mass effect. Moderate generalized atrophy. Vascular: No hyperdense vessel or unexpected calcification. Skull: High right parietal scalp laceration with skin staples. Negative for calvarial fracture. Sinuses/Orbits: No acute finding. CT CERVICAL SPINE FINDINGS Alignment: No traumatic malalignment. C5-6, C7-T1, and T1-T2  anterolisthesis is mild and facet mediated. Skull base and vertebrae: No  acute fracture. No aggressive process. Soft tissues and canal: No prevertebral fluid. No gross canal hematoma. Degenerative: Diffuse facet arthropathy. Lower cervical and upper thoracic spondylosis and disc narrowing. Extensive chondrocalcinosis of discs and posterior ligaments. Probable associated erosions in the odontoid tip suggesting a degree of CPPD arthropathy. IMPRESSION: 1. No evidence of acute intracranial or cervical spine injury. 2. Scalp laceration without calvarial fracture Electronically Signed   By: Monte Fantasia M.D.   On: 08/21/2015 07:09   Dg Chest Portable 1 View  08/21/2015  CLINICAL DATA:  Unwitnessed fall tonight EXAM: PORTABLE CHEST 1 VIEW COMPARISON:  01/14/2014 FINDINGS: A single AP portable view of the chest demonstrates no focal airspace consolidation or alveolar edema. The lungs are grossly clear. There is no large effusion or pneumothorax. Cardiac and mediastinal contours appear unremarkable. IMPRESSION: No active disease. Electronically Signed   By: Andreas Newport M.D.   On: 08/21/2015 06:16     Assessment and Plan  82 y.o. male   1. Sinus bradycardia/Sick sinus syndrome Long history of bradycardia, notes from cardiology dating back to September 2011 Blood pressure typically 130s to 150s, no evidence of hypotension Bradycardia at rest in bed and while sleeping Likely of limited clinical significance  --- Heart rate does improve on sitting up and exertion No strong indication for pacemaker at this time as bradycardia typically  noted when supine and at rest, maintaining adequate perfusion pressure -Seen by Dr. Caryl Comes, EP on rounds, no strong indication for pacemaker at this time  2. Tachycardia/arrhythmia Several nights ago, he had a short rhythm noted on telemetry, very rapid wide complex rhythm, No recurrent arrhythmia Monitor for now, may have been artifact  3) severe  dementia Previously seen by psychiatry Periods of behavioral disturbance Nursing report very agitated, requiring a sitter  4) weakness Long history of falls and weakness dating back several years, notes back in 2011 reporting falls at that time from leg weakness Ideally should not be able to ambulate without assistance or monitoring Should be using walker at all times  Recommend skilled nursing facility, not independent living  5) history of DVT, PE Bilateral PE October 2010, IVC filter in place  Total encounter time more than 25 minutes Greater than 50% was spent in counseling and coordination of care with the patient  Signed, Esmond Plants, MD, Ph.D. [facility] 08/30/2015, 2:28 PM

## 2015-08-30 NOTE — Progress Notes (Signed)
Patient remains restless and confused,safety sitter in room,telemetry discontinued,palliative follow up in progress.

## 2015-08-30 NOTE — Consult Note (Addendum)
Electrophysiology Consultation Note  Patient ID: Cody Zisk., MRN: [id number], DOB/AGE: [birth year] 80 y.o. Admit date: 08/28/2015   Date of Consult: 08/30/2015 Primary Physician: Pcp Not In System Primary Cardiologist: Caryl Comes  Chief Complaint: Fall, bradycardia Reason for Consult: Bradycardia, fall Consult placed by Dr. Darvin Neighbours  HPI: 82 y.o. male with h/o Severe dementia with periods of behavioral disturbance, prostate cancer, kidney stones, [facility], long-standing sinus bradycardia with heart rates in the 40s, several hospital admissions for fall dating back to 2015, who presents again after a fall. Cardiology saw for bradycardia and now asked to see re Pacemaker    Emergency room physician notes indicates he was "helping his wife" when he fell. He denied any lightheadedness, dizziness Details unclear. He had traumatic injury to the back of his head requiring stitches Nursing reports he is not using his walker, perhaps occasionally when he remembers He is not yet been evaluated by physical therapy Orthostatics in the chart show stable if not elevated blood pressure Orthostatics also show a climb in his heart rate with standing up to heart rate 96 bpm  He has been noted to have bradycardia while in hospital into the high 20s and low 30s. He has been at bedrest and so it is not clear as to whether these episodes are associated with symptoms.  Previous notes such as Jul 13, 2015 detailing mechanical fall with admission at that time. Reportedly lost his balance while walking in the hallway. For bradycardia at that time, Aricept was held  Echocardiogram 4/17 demonstrated normal LV function    They mentioned that he was DO NOT RESUSCITATE and would likely be of little benefit.       Past Medical History  Diagnosis Date  . Neoplasm, bladder   . Prostate cancer ([place])   . Sinus bradycardia   . Nephrolithiasis   . Incomplete emptying of bladder   . Hematuria    . Dementia   . Anemia   . DVT (deep venous thrombosis) ([facility])     PE also  . 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 Diverticulosis of intestine with bleeding       Most Recent Cardiac Studies: Previous echocardiogram Jul 13, 2015 with ejection fraction 50-55%    Surgical History:  Past Surgical History  Procedure Laterality Date  . Back surgery       Home Meds: Prior to Admission medications   Medication Sig Start Date End Date Taking? Authorizing Provider  acetaminophen (TYLENOL) 325 MG tablet Take 650 mg by mouth every 4 (four) hours as needed for mild pain, fever or headache.    Yes Historical Provider, MD  acetaminophen (TYLENOL) 500 MG tablet Take 500 mg by mouth every 4 (four) hours as needed for mild pain.    Yes Historical Provider, MD  alum & mag hydroxide-simeth (MAALOX PLUS) 400-400-40 MG/5ML suspension Take 15 mLs by mouth every 6 (six) hours as needed for indigestion.   Yes Historical Provider, MD  bisacodyl (BISACODYL) 5 MG EC tablet Take 10 mg by mouth daily as needed for moderate constipation.   Yes Historical Provider, MD  donepezil (ARICEPT) 5 MG tablet Take 5 mg by mouth at bedtime.   Yes Historical Provider, MD  guaifenesin (ROBITUSSIN) 100 MG/5ML syrup Take 10 mLs by mouth 3 (three) times daily as needed for cough.   Yes Historical Provider, MD  loperamide (IMODIUM A-D) 2 MG tablet Take 2 mg by mouth as needed for diarrhea or loose stools.  Yes Historical Provider, MD  magnesium hydroxide (MILK OF MAGNESIA) 400 MG/5ML suspension Take 30 mLs by mouth daily as needed for mild constipation.   Yes Historical Provider, MD  mirtazapine (REMERON) 15 MG tablet Take 15 mg by mouth at bedtime.   Yes Historical Provider, MD  neomycin-bacitracin-polymyxin (NEOSPORIN) [phone] ointment Apply 1 application topically as needed (wound care). Pt applies to right upper arm.   Yes Historical Provider, MD  omeprazole (PRILOSEC) 20 MG capsule Take 20 mg by mouth daily.    Yes Historical Provider, MD  potassium chloride (K-DUR,KLOR-CON) 10 MEQ tablet Take 10 mEq by mouth daily.   Yes Historical Provider, MD  Probiotic Product ([place]) Take 1 capsule by mouth 2 (two) times daily.   Yes Historical Provider, MD  QUEtiapine (SEROQUEL) 50 MG tablet Take 50 mg by mouth 2 (two) times daily.   Yes Historical Provider, MD  Vitamin D, Ergocalciferol, (DRISDOL) 50000 units CAPS capsule Take 50,000 Units by mouth once a week. Pt takes on Tuesday.   Yes Historical Provider, MD    Inpatient Medications:  . cephALEXin  500 mg Oral Q12H  . docusate sodium  100 mg Oral BID  . enoxaparin (LOVENOX) injection  40 mg Subcutaneous Q24H  . mirtazapine  15 mg Oral QHS  . pantoprazole  40 mg Oral Daily  . potassium chloride  10 mEq Oral Daily  . QUEtiapine  50 mg Oral BID  . sodium chloride flush  3 mL Intravenous Q12H      Allergies: No Known Allergies  Social History   Social History  . Marital Status: Married    Spouse Name: N/A  . Number of Children: N/A  . Years of Education: N/A   Occupational History  . retired    Social History Main Topics  . Smoking status: Never Smoker   . Smokeless tobacco: Not on file     Comment: tobacco use -no  . Alcohol Use: No  . Drug Use: No  . Sexual Activity: Not on file   Other Topics Concern  . Not on file   Social History Narrative   Married, retired, does not get regular exercise.      Family History  Problem Relation Age of Onset  . Hypertension Father     deceased     Review of Systems: Review of Systems  Unable to perform ROS   Labs:  Recent Labs  08/28/15 0508  TROPONINI 0.03*   Lab Results  Component Value Date   WBC 5.5 08/28/2015   HGB 11.5* 08/28/2015   HCT 34.7* 08/28/2015   MCV 80.8 08/28/2015   PLT 198 08/28/2015     Recent Labs Lab 08/28/15 0508 08/29/15 0826  NA 142  --   K 3.4* 3.5  CL 110  --   CO2 25  --   BUN 15  --   CREATININE 0.90  --   CALCIUM 9.4   --   PROT 6.6  --   BILITOT 0.7  --   ALKPHOS 137*  --   ALT 22  --   AST 34  --   GLUCOSE 99  --    No results found for: CHOL, HDL, LDLCALC, TRIG No results found for: DDIMER  Radiology/Studies:  Ct Head Wo Contrast  08/28/2015  CLINICAL DATA:  Status post unwitnessed fall, with knot at the back of the head. Confusion. Concern for head or cervical spine injury. Initial encounter. EXAM: CT HEAD WITHOUT CONTRAST CT CERVICAL  SPINE WITHOUT CONTRAST TECHNIQUE: Multidetector CT imaging of the head and cervical spine was performed following the standard protocol without intravenous contrast. Multiplanar CT image reconstructions of the cervical spine were also generated. COMPARISON:  CT of the head and cervical spine performed 08/21/2015 FINDINGS: CT HEAD FINDINGS There is no evidence of acute infarction, mass lesion, or intra- or extra-axial hemorrhage on CT. Prominence of ventricles and sulci reflects moderate cortical volume loss. Cerebellar atrophy is noted. The brainstem and fourth ventricle are within normal limits. The basal ganglia are unremarkable in appearance. The cerebral hemispheres demonstrate grossly normal gray-white differentiation. No mass effect or midline shift is seen. There is no evidence of fracture; visualized osseous structures are unremarkable in appearance. The orbits are within normal limits. The paranasal sinuses and mastoid air cells are well-aerated. No significant soft tissue abnormalities are seen. CT CERVICAL SPINE FINDINGS There is no evidence of fracture or subluxation. There is minimal grade 1 anterolisthesis of C5 on C6, of C6 on C7, of C7 on T1, and of T1 on T2. Scattered anterior and posterior disc osteophyte complexes are seen along the cervical spine. Underlying facet disease is noted. Prevertebral soft tissues are within normal limits. The thyroid gland is unremarkable in appearance. The visualized lung apices are clear. Mild calcification is noted at the carotid  bifurcations bilaterally. IMPRESSION: 1. No evidence of traumatic intracranial injury or fracture. 2. No evidence of fracture or subluxation along the cervical spine. 3. Moderate cortical volume loss noted. 4. Mild degenerative change along the cervical spine. 5. Mild calcification at the carotid bifurcations bilaterally. Carotid ultrasound would be helpful for further evaluation, when and as deemed clinically appropriate. Electronically Signed   By: Garald Balding M.D.   On: 08/28/2015 06:51   Ct Head Wo Contrast  08/21/2015  CLINICAL DATA:  Fall with occipital scalp laceration. Initial encounter. EXAM: CT HEAD WITHOUT CONTRAST CT CERVICAL SPINE WITHOUT CONTRAST TECHNIQUE: Multidetector CT imaging of the head and cervical spine was performed following the standard protocol without intravenous contrast. Multiplanar CT image reconstructions of the cervical spine were also generated. COMPARISON:  06/23/2015 head CT.  03/10/2015 cervical spine CT FINDINGS: CT HEAD FINDINGS Brain: No evidence of acute infarction, hemorrhage, hydrocephalus, or mass lesion/mass effect. Moderate generalized atrophy. Vascular: No hyperdense vessel or unexpected calcification. Skull: High right parietal scalp laceration with skin staples. Negative for calvarial fracture. Sinuses/Orbits: No acute finding. CT CERVICAL SPINE FINDINGS Alignment: No traumatic malalignment. C5-6, C7-T1, and T1-T2 anterolisthesis is mild and facet mediated. Skull base and vertebrae: No  acute fracture. No aggressive process. Soft tissues and canal: No prevertebral fluid. No gross canal hematoma. Degenerative: Diffuse facet arthropathy. Lower cervical and upper thoracic spondylosis and disc narrowing. Extensive chondrocalcinosis of discs and posterior ligaments. Probable associated erosions in the odontoid tip suggesting a degree of CPPD arthropathy. IMPRESSION: 1. No evidence of acute intracranial or cervical spine injury. 2. Scalp laceration without calvarial  fracture Electronically Signed   By: Monte Fantasia M.D.   On: 08/21/2015 07:09   Ct Cervical Spine Wo Contrast  08/28/2015  CLINICAL DATA:  Status post unwitnessed fall, with knot at the back of the head. Confusion. Concern for head or cervical spine injury. Initial encounter. EXAM: CT HEAD WITHOUT CONTRAST CT CERVICAL SPINE WITHOUT CONTRAST TECHNIQUE: Multidetector CT imaging of the head and cervical spine was performed following the standard protocol without intravenous contrast. Multiplanar CT image reconstructions of the cervical spine were also generated. COMPARISON:  CT of the head and cervical spine  performed 08/21/2015  IMPRESSION: 1. No evidence of traumatic intracranial injury or fracture. 2. No evidence of fracture or subluxation along the cervical spine. 3. Moderate cortical volume loss noted. 4. Mild degenerative change along the cervical spine. 5. Mild calcification at the carotid bifurcations bilaterally. Carotid ultrasound would be helpful for further evaluation, when and as deemed clinically appropriate. Electronically Signed   By: Garald Balding M.D.   On: 08/28/2015 06:51   Ct Cervical Spine Wo Contrast  08/21/2015   IMPRESSION: 1. No evidence of acute intracranial or cervical spine injury. 2. Scalp laceration without calvarial fracture Electronically Signed   By: Monte Fantasia M.D.   On: 08/21/2015 07:09   Dg Chest Portable 1 View  08/21/2015  CLINICAL DATA:  IMPRESSION: No active disease. Electronically Signed   By: Andreas Newport M.D.   On: 08/21/2015 06:16    EKG: Sinus@ 42 21/14/46 , right bundle branch block Axis -22  Weights: Filed Weights   08/28/15 0932 08/29/15 0440 08/30/15 0415  Weight: 157 lb 11.2 oz (71.532 kg) 157 lb 4.8 oz (71.351 kg) 148 lb (67.132 kg)     Physical Exam: Blood pressure 131/69, pulse 64, temperature 97.9 F (36.6 C), temperature source Oral, resp. rate 20, height 5\' 8"  (1.727 m), weight 148 lb (67.132 kg), SpO2 95 %. Body mass index  is 22.51 kg/(m^2). General: Well developed, well nourished, in no acute distress. Head: Normocephalic, atraumatic, sclera non-icteric, no xanthomas, nares are without discharge.  Neck: Negative for carotid bruits. JVD >6 Lungs: Clear bilaterally to auscultation anteriorly without wheezes, rales, or rhonchi. Breathing is unlabored. Heart: R but slow RR with S1 S2. No murmurs, rubs, or gallops appreciated. Abdomen: Soft, non-tender, non-distended with normoactive bowel sounds. No hepatomegaly. No rebound/guarding. No obvious abdominal masses. Msk:  Strength and tone appear normal for age. Extremities: No clubbing or cyanosis. No edema.  Distal pedal pulses are 2+ and equal bilaterally. Neuro: Alert but no coherent response. No facial asymmetry. No focal deficit. Moves all extremities spontaneously. Psych:As above     Assessment and Plan:   1). Sinus bradycardia  2).Wide complex tachycardia  3) severe dementia   4) weakness   5) history of DVT, PE Bilateral PE October 2010, IVC filter in place   The patient has bradycardia and falls. When upright his heart rate has been normal. It is a reasonable presumption but unproven that the bradycardia and the falls are related. He is significantly demented. He is unable to answer questions coherently. Try and understand goals of therapy thus is essential. In the event that we were to proceed with pacing, I would be concerned about his compliance restrictions given his agitation and his inability to comprehend. I agree with Dr. Theda Sers question as to the significance of his bradycardia. On these episodes have occurred in the daytime hours is not at all clear to me that he is awake. All examined today while awake his heart rate was in the 60s.   While his bradycardia he is long-standing with documented heart rates in the 30s-50s over recent years, he clearly has progressive sinus node dysfunction with heart rates now in the 20s even with only on  occasion.    We have rarely used theophylline as a sinus node provocative agent. It has a narrow therapeutic index.  His wide-complex tachycardia is likely sinus given his underlying right bundle branch block particularly in the context of normal LV function  Will follow with you

## 2015-08-30 NOTE — Progress Notes (Signed)
New referral for [facility] services at [place] following discharge received from [facility]. [name] is a 82 year old man admitted to [facility] on 7/4 for assessment of bradycardia, heart rate at times has been in the 20's. He has PMH of Dementia w/behavior disturbances, Prostate Ca, Bladder Ca, IVC filter placement, anemia and diverticulosis with bleeding as well as a history of multiple falls, 3 ED visits in the past 6 months w/2 admissions. He has been evaluated by cardiology and family has met with Palliative Medicine NP Cody Gordon. Focus has been shifted to full comfort, no pacemaker placement. Family has chosen for patient to return to familiar surroundings at [facility] with hospice services. Patient seen sitting up in the recliner, sitter and former neighbor visiting. He was pleasantly confused, oriented to self and familiars. He was able to walk with a  Walker with PT today. He is restless at times, sitter reports he is easily redirected and is very focused on "going home" . Per chart note review he has had a very poor appetite, eating applesauce and pudding, has taken oral medications. Writer contacted patients Cody Gordon, his niece, to intitiate education regarding hospice services, philosophy and team approach to care with good understanding voiced.  Plan is for discharge tomorrow 7/7. Patient information faxed to hospice referral. Patient has a portable DNR in place in his hospital chart. Thank you for the opportunity to be involved in the care of this patient and his family. Will continue to follow through final disposition. Cody Shanks RN, BSN, [place] [facility] [place], [facility] [phone] c

## 2015-08-31 ENCOUNTER — Emergency Department
Admission: EM | Admit: 2015-08-31 | Discharge: 2015-08-31 | Disposition: A | Discharge status: Home / Self Care | Payer: Medicare Other | Attending: Emergency Medicine | Admitting: Emergency Medicine

## 2015-08-31 ENCOUNTER — Emergency Department: Payer: Medicare Other

## 2015-08-31 DIAGNOSIS — S0101XA Laceration without foreign body of scalp, initial encounter: Secondary | ICD-10-CM

## 2015-08-31 DIAGNOSIS — Z8719 Personal history of other diseases of the digestive system: Secondary | ICD-10-CM | POA: Insufficient documentation

## 2015-08-31 DIAGNOSIS — W1830XA Fall on same level, unspecified, initial encounter: Secondary | ICD-10-CM | POA: Insufficient documentation

## 2015-08-31 DIAGNOSIS — Z86718 Personal history of other venous thrombosis and embolism: Secondary | ICD-10-CM | POA: Insufficient documentation

## 2015-08-31 DIAGNOSIS — Z8546 Personal history of malignant neoplasm of prostate: Secondary | ICD-10-CM | POA: Insufficient documentation

## 2015-08-31 DIAGNOSIS — Y999 Unspecified external cause status: Secondary | ICD-10-CM | POA: Insufficient documentation

## 2015-08-31 DIAGNOSIS — W19XXXA Unspecified fall, initial encounter: Secondary | ICD-10-CM

## 2015-08-31 DIAGNOSIS — Z79899 Other long term (current) drug therapy: Secondary | ICD-10-CM | POA: Insufficient documentation

## 2015-08-31 DIAGNOSIS — Y9289 Other specified places as the place of occurrence of the external cause: Secondary | ICD-10-CM | POA: Insufficient documentation

## 2015-08-31 DIAGNOSIS — Y939 Activity, unspecified: Secondary | ICD-10-CM | POA: Insufficient documentation

## 2015-08-31 MED ORDER — MORPHINE SULFATE (CONCENTRATE) 10 MG /0.5 ML PO SOLN: 5.0000 mg | ORAL | Status: AC | PRN

## 2015-08-31 MED ORDER — DIVALPROEX SODIUM 125 MG PO CSDR: 250.0000 mg | DELAYED_RELEASE_CAPSULE | Freq: Every day | ORAL | Status: AC

## 2015-08-31 MED ORDER — LORAZEPAM 0.5 MG PO TABS: 0.5000 mg | ORAL_TABLET | Freq: Four times a day (QID) | ORAL | Status: AC | PRN

## 2015-08-31 NOTE — Progress Notes (Signed)
Patient discharging to [facility] with Hospice. Packet prepared by [employer], instructed I did not need to call report. EMS called for transport.

## 2015-08-31 NOTE — Care Management (Signed)
Hospice services have been arranged at ALF with [facility].It is anticipated that patient will discharge today.

## 2015-08-31 NOTE — Discharge Instructions (Signed)
Keep laceration dry and clean. Wash with warm water and soap. Apply topical bacitracin. Protect from the sun to minimize scarring. Cover it with SPF 70 or higher and use hat when out in the sun for 6-9 months. You received 3 staples that must be removed in 7 days.  Watch for signs of infection: pus, redness of the skin surrounding it, or fever. If these develop see your doctor or return to the ER for antibiotics. ° °

## 2015-08-31 NOTE — Progress Notes (Addendum)
Clinical Social Worker was informed that patient will be medically ready to discharge to [facility]. Patient and his nieceBarbaraann Share are in a agreement with plan. CSW called [name] at [facility] to confirm that patient's bed is ready.  All discharge information faxed to [facility] via [place]. DNR added to discharge packet. CSW informed Santiago Glad- Liaison with [facility] that patient is discharging today.   Call to patient's [name]- Nephew left message to inform him patient would discharge to [facility]. RN will call report and patient will discharge to [facility] via EMS.  Ernest Pine, MSW, [place], [place] Clinical Social Worker [phone]

## 2015-08-31 NOTE — NC FL2 (Signed)
[place] LEVEL OF CARE SCREENING TOOL     IDENTIFICATION  Patient Name: Cody Gordon. Birthdate: Jan 23, 1921 Sex: male Admission Date (Current Location): 08/28/2015  [place] and [place] Number:  Engineering geologist and Address:  [facility], [address] [place], [place] [address]      Provider Number: [id number]  Attending Physician Name and Address:  Hillary Bow, MD  Relative Name and Phone Number:       Current Level of Care: Hospital Recommended Level of Care: [place] Prior Approval Number:    Date Approved/Denied:   PASRR Number:  ([id number])  Discharge Plan: Domiciliary (Rest home) ([facility])    Current Diagnoses: Patient Active Problem List   Diagnosis Date Noted  . DNR (do not resuscitate)   . Palliative care encounter   . NSVT (nonsustained ventricular tachycardia) ([place])   . Severe dementia   . Sick sinus syndrome ([place])   . Dementia   . Unsteady gait   . Fall 06/23/2015  . Bradycardia 06/23/2015  . Pressure ulcer 06/23/2015  . CARDIAC ARRHYTHMIA 11/14/2009  . DIZZINESS 11/14/2009  . SHORTNESS OF BREATH 11/14/2009    Orientation RESPIRATION BLADDER Height & Weight     Self  Normal Continent Weight: 147 lb 4.8 oz (66.815 kg) Height:  5\' 8"  (172.7 cm)  BEHAVIORAL SYMPTOMS/MOOD NEUROLOGICAL BOWEL NUTRITION STATUS   (None)  (None) Continent Diet (Regular)  AMBULATORY STATUS COMMUNICATION OF NEEDS Skin   Limited Assist Verbally Normal                       Personal Care Assistance Level of Assistance  Bathing, Feeding, Dressing Bathing Assistance: Limited assistance Feeding assistance: Independent Dressing Assistance: Limited assistance     Functional Limitations Info  Sight, Hearing, Speech Sight Info: Adequate Hearing Info: Adequate Speech Info: Adequate    SPECIAL CARE FACTORS FREQUENCY                       Contractures      Additional Factors  Info  Code Status, Allergies Code Status Info:  (DNR) Allergies Info:  (No Known Allergies)             Current Discharge Medication List    START taking these medications   Details  divalproex (DEPAKOTE SPRINKLE) 125 MG capsule Take 2 capsules (250 mg total) by mouth at bedtime. Qty: 30 capsule, Refills: 0    LORazepam (ATIVAN) 0.5 MG tablet Take 1 tablet (0.5 mg total) by mouth every 6 (six) hours as needed for anxiety. Qty: 30 tablet, Refills: 0    Morphine Sulfate (MORPHINE CONCENTRATE) 10 mg / 0.5 ml concentrated solution Take 0.25 mLs (5 mg total) by mouth every 4 (four) hours as needed for severe pain. Qty: 15 mL, Refills: 0      CONTINUE these medications which have NOT CHANGED   Details  acetaminophen (TYLENOL) 325 MG tablet Take 650 mg by mouth every 4 (four) hours as needed for mild pain, fever or headache.     alum & mag hydroxide-simeth (MAALOX PLUS) 400-400-40 MG/5ML suspension Take 15 mLs by mouth every 6 (six) hours as needed for indigestion.    bisacodyl (BISACODYL) 5 MG EC tablet Take 10 mg by mouth daily as needed for moderate constipation.    donepezil (ARICEPT) 5 MG tablet Take 5 mg by mouth at bedtime.    guaifenesin (ROBITUSSIN) 100 MG/5ML syrup Take 10  mLs by mouth 3 (three) times daily as needed for cough.    loperamide (IMODIUM A-D) 2 MG tablet Take 2 mg by mouth as needed for diarrhea or loose stools.    magnesium hydroxide (MILK OF MAGNESIA) 400 MG/5ML suspension Take 30 mLs by mouth daily as needed for mild constipation.    mirtazapine (REMERON) 15 MG tablet Take 15 mg by mouth at bedtime.    neomycin-bacitracin-polymyxin (NEOSPORIN) [phone] ointment Apply 1 application topically as needed (wound care). Pt applies to right upper arm.    omeprazole (PRILOSEC) 20 MG capsule Take 20 mg by mouth daily.    potassium chloride (K-DUR,KLOR-CON) 10 MEQ tablet Take 10 mEq by mouth daily.     Probiotic Product ([facility] PO) Take 1 capsule by mouth 2 (two) times daily.    QUEtiapine (SEROQUEL) 50 MG tablet Take 50 mg by mouth 2 (two) times daily.    Vitamin D, Ergocalciferol, (DRISDOL) 50000 units CAPS capsule Take 50,000 Units by mouth once a week. Pt takes on Tuesday.           Relevant Imaging Results:  Relevant Lab Results:   Additional Information  (SSN [id number])  Lorenso Quarry Leilany Digeronimo, LCSW

## 2015-08-31 NOTE — Discharge Summary (Signed)
[facility] [place] NAME: Cody Gordon    MR#:  [id number]  DATE OF BIRTH:  February 17, 1921  DATE OF ADMISSION:  08/28/2015 ADMITTING PHYSICIAN: Hillary Bow, MD  DATE OF DISCHARGE: 08/31/2015  PRIMARY CARE PHYSICIAN: Pcp Not In System   ADMISSION DIAGNOSIS:  Bradycardia [R00.1] Fall, initial encounter [W19.XXXA]  DISCHARGE DIAGNOSIS:  Active Problems:   Bradycardia   NSVT (nonsustained ventricular tachycardia) (HCC)   Severe dementia   Sick sinus syndrome ([place])   DNR (do not resuscitate)   Palliative care encounter   SECONDARY DIAGNOSIS:   Past Medical History  Diagnosis Date  . Neoplasm, bladder   . Prostate cancer ([place])   . Sinus bradycardia   . Nephrolithiasis   . Incomplete emptying of bladder   . Hematuria   . Dementia   . Anemia   . DVT (deep venous thrombosis) (HCC)     PE also  . 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 Diverticulosis of intestine with bleeding      ADMITTING HISTORY  Cody Gordon is a 82 y.o. male with a known history of Sick sinus syndrome, dementia, and DVT presents to the emergency room due to fall. Patient has had recurrent admissions for falls, syncope with bradycardia. During his last admission his Aricept was stopped. Patient had heart rate dipping into the 20s in the emergency room. Patient is being admitted for pacemaker placement.  Patient is a poor historian due to dementia.  HOSPITAL COURSE:    * Symptomatic bradycardia with syncopal episodes Unclear if bradycardia is causing the falls. Patient has had recurrent admissions for the same problem. Aricept was held last time. Consulted cardiology. Pacemaker deferred Seen by Ep Dr. Caryl Comes.  * Hypomagnesemia Replaced through IV  * Dementia Watch for inpatient delirium  * History of DVT in 2010 Held Eliquis due to Recurrent falls.  * DVT prophylaxis with Lovenox  Discussed with palliative care. Patient  has had progressive decline over the past few months. This point plan is to discharge patient back to assisted living facility with hospice services  Added Roxanol and ativan as needed  CONSULTS OBTAINED:  Treatment Team:  Minna Merritts, MD  DRUG ALLERGIES:  No Known Allergies  DISCHARGE MEDICATIONS:   Current Discharge Medication List    START taking these medications   Details  divalproex (DEPAKOTE SPRINKLE) 125 MG capsule Take 2 capsules (250 mg total) by mouth at bedtime. Qty: 30 capsule, Refills: 0    LORazepam (ATIVAN) 0.5 MG tablet Take 1 tablet (0.5 mg total) by mouth every 6 (six) hours as needed for anxiety. Qty: 30 tablet, Refills: 0    Morphine Sulfate (MORPHINE CONCENTRATE) 10 mg / 0.5 ml concentrated solution Take 0.25 mLs (5 mg total) by mouth every 4 (four) hours as needed for severe pain. Qty: 15 mL, Refills: 0      CONTINUE these medications which have NOT CHANGED   Details  acetaminophen (TYLENOL) 325 MG tablet Take 650 mg by mouth every 4 (four) hours as needed for mild pain, fever or headache.     alum & mag hydroxide-simeth (MAALOX PLUS) 400-400-40 MG/5ML suspension Take 15 mLs by mouth every 6 (six) hours as needed for indigestion.    bisacodyl (BISACODYL) 5 MG EC tablet Take 10 mg by mouth daily as needed for moderate constipation.    donepezil (ARICEPT) 5 MG tablet Take 5 mg by mouth at bedtime.    guaifenesin (  ROBITUSSIN) 100 MG/5ML syrup Take 10 mLs by mouth 3 (three) times daily as needed for cough.    loperamide (IMODIUM A-D) 2 MG tablet Take 2 mg by mouth as needed for diarrhea or loose stools.    magnesium hydroxide (MILK OF MAGNESIA) 400 MG/5ML suspension Take 30 mLs by mouth daily as needed for mild constipation.    mirtazapine (REMERON) 15 MG tablet Take 15 mg by mouth at bedtime.    neomycin-bacitracin-polymyxin (NEOSPORIN) [phone] ointment Apply 1 application topically as needed (wound care). Pt applies to right upper arm.     omeprazole (PRILOSEC) 20 MG capsule Take 20 mg by mouth daily.    potassium chloride (K-DUR,KLOR-CON) 10 MEQ tablet Take 10 mEq by mouth daily.    Probiotic Product (PHILLIPS COLON HEALTH PO) Take 1 capsule by mouth 2 (two) times daily.    QUEtiapine (SEROQUEL) 50 MG tablet Take 50 mg by mouth 2 (two) times daily.    Vitamin D, Ergocalciferol, (DRISDOL) 50000 units CAPS capsule Take 50,000 Units by mouth once a week. Pt takes on Tuesday.        Today   VITAL SIGNS:  Blood pressure 142/76, pulse 101, temperature 97.9 F (36.6 C), temperature source Axillary, resp. rate 18, height 5\' 8"  (1.727 m), weight 66.815 kg (147 lb 4.8 oz), SpO2 93 %.  I/O:   Intake/Output Summary (Last 24 hours) at 08/31/15 1353 Last data filed at 08/31/15 0731  Gross per 24 hour  Intake      0 ml  Output      0 ml  Net      0 ml    PHYSICAL EXAMINATION:  Physical Exam  GENERAL:  82 y.o.-year-old patient lying in the bed LUNGS: Coarse breath sounds CARDIOVASCULAR: S1, S2 NEUROLOGIC: Moves all 4 extremities. PSYCHIATRIC: The patient is drowzy  DATA REVIEW:   CBC  Recent Labs Lab 08/28/15 0508  WBC 5.5  HGB 11.5*  HCT 34.7*  PLT 198    Chemistries   Recent Labs Lab 08/28/15 0508 08/29/15 0826  NA 142  --   K 3.4* 3.5  CL 110  --   CO2 25  --   GLUCOSE 99  --   BUN 15  --   CREATININE 0.90  --   CALCIUM 9.4  --   MG  --  1.5*  AST 34  --   ALT 22  --   ALKPHOS 137*  --   BILITOT 0.7  --     Cardiac Enzymes  Recent Labs Lab 08/28/15 0508  TROPONINI 0.03*    Microbiology Results  Results for orders placed or performed during the hospital encounter of 08/28/15  MRSA PCR Screening     Status: None   Collection Time: 08/28/15 10:37 AM  Result Value Ref Range Status   MRSA by PCR NEGATIVE NEGATIVE Final    Comment:        The GeneXpert MRSA Assay (FDA approved for NASAL specimens only), is one component of a comprehensive MRSA colonization surveillance program.  It is not intended to diagnose MRSA infection nor to guide or monitor treatment for MRSA infections.     RADIOLOGY:  No results found.  Follow up with PCP in 1 week.  Management plans discussed with the patient, family and they are in agreement.  CODE STATUS:     Code Status Orders        Start     Ordered   08/28/15 0831  Do not attempt resuscitation (  DNR)   Continuous    Question Answer Comment  In the event of cardiac or respiratory ARREST Do not call a "code blue"   In the event of cardiac or respiratory ARREST Do not perform Intubation, CPR, defibrillation or ACLS   In the event of cardiac or respiratory ARREST Use medication by any route, position, wound care, and other measures to relive pain and suffering. May use oxygen, suction and manual treatment of airway obstruction as needed for comfort.      08/28/15 0830    Code Status History    Date Active Date Inactive Code Status Order ID Comments User Context   06/23/2015  4:44 AM 06/26/2015  4:29 PM DNR [id number]  Saundra Shelling, MD Inpatient    Advance Directive Documentation        Most Recent Value   Type of Advance Directive  Out of facility DNR (pink MOST or yellow form)   Pre-existing out of facility DNR order (yellow form or pink MOST form)     "MOST" Form in Place?        TOTAL TIME TAKING CARE OF THIS PATIENT ON DAY OF DISCHARGE: more than 30 minutes.   Hillary Bow R M.D on 08/31/2015 at 1:53 PM  Between 7am to 6pm - Pager - [phone]  After 6pm go to [email or web address] - password [email or web address] [facility]  Office  [phone]  CC: Primary care physician; Pcp Not In System  Note: This dictation was prepared with Dragon dictation along with smaller phrase technology. Any transcriptional errors that result from this process are unintentional.

## 2015-08-31 NOTE — ED Notes (Signed)
Pt arrived to ED via EMS from [facility]. Per EMS pt fell on the 4th of July resulting in a laceration to the back of head that was sutured, and was admitted to [facility]. Pt was just discharged 45 minutes ago from inpatient unit, and at [place] pt rolled out of bed and fell on the ground reopening the sutures to the back of his head. Pt disoriented at baseline in no acute distress at this time.

## 2015-08-31 NOTE — Progress Notes (Signed)
Per night shift, patient had a rough night and had to get ativan twice. Patient has been sleeping all morning. Niece stopped by for a little while. Dr. Darvin Neighbours has rounded. Plan is to discharge with hospice today. Sitter at bedside. Will continue to monitor.

## 2015-08-31 NOTE — Discharge Instructions (Signed)

## 2015-08-31 NOTE — ED Provider Notes (Signed)
[facility] Emergency Department Provider Note  ____________________________________________  Time seen: Approximately 6:07 PM  I have reviewed the triage vital signs and the nursing notes.   HISTORY  Chief Complaint Fall   HPI Cody Gordon. is a 82 y.o. male with a history of dementia, sick sinus syndrome, prostate cancer, DVT who presents for evaluation after an unwitnessed fall. Patient was admitted at [facility] on 7/4 for fall when he was found to be bradycardic and had episodes of V. tach.Patient he was evaluated by cardiology but pacemaker placement was deferred at that time as family opted for full comfort care patient was discharged back to [facility] with hospice care. 45 minutes after arriving there he was found on the ground between his dresser and recliner. Patient had a scalp laceration that was bleeding. Patient was confused and was sent back for evaluation. History is limited at this time as patient is unable to provide any history. Patient is DNR/DNI.   Past Medical History  Diagnosis Date  . Neoplasm, bladder   . Prostate cancer ([place])   . Sinus bradycardia   . Nephrolithiasis   . Incomplete emptying of bladder   . Hematuria   . Dementia   . Anemia   . DVT (deep venous thrombosis) (HCC)     PE also  . 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 Diverticulosis of intestine with bleeding     Patient Active Problem List   Diagnosis Date Noted  . DNR (do not resuscitate)   . Palliative care encounter   . NSVT (nonsustained ventricular tachycardia) ([place])   . Severe dementia   . Sick sinus syndrome ([place])   . Dementia   . Unsteady gait   . Fall 06/23/2015  . Bradycardia 06/23/2015  . Pressure ulcer 06/23/2015  . CARDIAC ARRHYTHMIA 11/14/2009  . DIZZINESS 11/14/2009  . SHORTNESS OF BREATH 11/14/2009    Past Surgical History  Procedure Laterality Date  . Back surgery      Current Outpatient Rx  Name  Route   Sig  Dispense  Refill  . acetaminophen (TYLENOL) 325 MG tablet   Oral   Take 650 mg by mouth every 4 (four) hours as needed for mild pain, fever or headache.          Marland Kitchen alum & mag hydroxide-simeth (MAALOX PLUS) 400-400-40 MG/5ML suspension   Oral   Take 15 mLs by mouth every 6 (six) hours as needed for indigestion.         . bisacodyl (BISACODYL) 5 MG EC tablet   Oral   Take 10 mg by mouth daily as needed for moderate constipation.         . divalproex (DEPAKOTE SPRINKLE) 125 MG capsule   Oral   Take 2 capsules (250 mg total) by mouth at bedtime.   30 capsule   0   . donepezil (ARICEPT) 5 MG tablet   Oral   Take 5 mg by mouth at bedtime.         Marland Kitchen guaifenesin (ROBITUSSIN) 100 MG/5ML syrup   Oral   Take 10 mLs by mouth 3 (three) times daily as needed for cough.         . loperamide (IMODIUM A-D) 2 MG tablet   Oral   Take 2 mg by mouth as needed for diarrhea or loose stools.         Marland Kitchen LORazepam (ATIVAN) 0.5 MG tablet   Oral  Take 1 tablet (0.5 mg total) by mouth every 6 (six) hours as needed for anxiety.   30 tablet   0   . magnesium hydroxide (MILK OF MAGNESIA) 400 MG/5ML suspension   Oral   Take 30 mLs by mouth daily as needed for mild constipation.         . mirtazapine (REMERON) 15 MG tablet   Oral   Take 15 mg by mouth at bedtime.         . Morphine Sulfate (MORPHINE CONCENTRATE) 10 mg / 0.5 ml concentrated solution   Oral   Take 0.25 mLs (5 mg total) by mouth every 4 (four) hours as needed for severe pain.   15 mL   0   . neomycin-bacitracin-polymyxin (NEOSPORIN) [phone] ointment   Topical   Apply 1 application topically as needed (wound care). Pt applies to right upper arm.         Marland Kitchen omeprazole (PRILOSEC) 20 MG capsule   Oral   Take 20 mg by mouth daily.         . potassium chloride (K-DUR,KLOR-CON) 10 MEQ tablet   Oral   Take 10 mEq by mouth daily.         . Probiotic Product (PHILLIPS COLON HEALTH PO)   Oral   Take 1  capsule by mouth 2 (two) times daily.         . QUEtiapine (SEROQUEL) 50 MG tablet   Oral   Take 50 mg by mouth 2 (two) times daily.         . Vitamin D, Ergocalciferol, (DRISDOL) 50000 units CAPS capsule   Oral   Take 50,000 Units by mouth once a week. Pt takes on Tuesday.           Allergies Review of patient's allergies indicates no known allergies.  Family History  Problem Relation Age of Onset  . Hypertension Father     deceased    Social History Social History  Substance Use Topics  . Smoking status: Never Smoker   . Smokeless tobacco: None     Comment: tobacco use -no  . Alcohol Use: No    Review of Systems  Constitutional: Negative for fever. + fall Eyes: Negative for visual changes. ENT: Negative for sore throat. Cardiovascular: Negative for chest pain. Respiratory: Negative for shortness of breath. Gastrointestinal: Negative for abdominal pain, vomiting or diarrhea. Genitourinary: Negative for dysuria. Musculoskeletal: Negative for back pain. Skin: Negative for rash. + scalp laceration Neurological: Negative for headaches, weakness or numbness.  ____________________________________________   PHYSICAL EXAM:  VITAL SIGNS: ED Triage Vitals  Enc Vitals Group     BP 08/31/15 1743 129/84 mmHg     Pulse Rate 08/31/15 1743 80     Resp 08/31/15 1743 12     Temp 08/31/15 1743 97.1 F (36.2 C)     Temp Source 08/31/15 1743 Axillary     SpO2 08/31/15 1738 95 %     Weight 08/31/15 1743 160 lb (72.576 kg)     Height 08/31/15 1743 6' (1.829 m)     Head Cir --      Peak Flow --      Pain Score --      Pain Loc --      Pain Edu? --      Excl. in [place]? --     Constitutional: Alert and oriented x 2.No distress HEENT:      Head: Normocephalic and atraumatic.   2 cm laceration to the  right posterior scalp that is hemostatic      Eyes: Conjunctivae are normal. Sclera is non-icteric. EOMI. PERRL      Mouth/Throat: Mucous membranes are moist.       Neck:  Supple with no signs of meningismus. No tenderness to palpation of the C-spine, no deformities or step-offs Cardiovascular: Regular rate and rhythm. No murmurs, gallops, or rubs. 2+ symmetrical distal pulses are present in all extremities. No JVD. Respiratory: Normal respiratory effort. Lungs are clear to auscultation bilaterally. No wheezes, crackles, or rhonchi.  Gastrointestinal: Soft, non tender, and non distended with positive bowel sounds. No rebound or guarding. Musculoskeletal: Nontender with normal range of motion in all extremities. No T and L-spine tenderness, no hip tenderness. No edema, cyanosis, or erythema of extremities. Neurologic: Slurred speech (per HCPOA this is patient's baseline). Face is symmetric. Moving all extremities. No gross focal neurologic deficits are appreciated. Skin: Skin is warm, dry and intact. No rash noted. Psychiatric: Mood and affect are normal. Speech and behavior are normal.  ____________________________________________   LABS (all labs ordered are listed, but only abnormal results are displayed)  Labs Reviewed - No data to display ____________________________________________  EKG  none ____________________________________________  RADIOLOGY  CT head and cervical spine: Negative for acute pathology  ____________________________________________   PROCEDURES  Procedure(s) performed:yes  LACERATION REPAIR Performed by: [place] by: Rudene Re Consent: Verbal consent obtained. Risks and benefits: risks, benefits and alternatives were discussed Consent given by: patient Patient identity confirmed: provided demographic data Prepped and Draped in normal sterile fashion Wound explored  Laceration Location: Posterior scalp  Laceration Length: 2.0 cm  No Foreign Bodies seen or palpated  Anesthesia: none  Irrigation method: syringe Amount of cleaning: standard  Skin closure: 3 staples  Patient tolerance:  Patient tolerated the procedure well with no immediate complications.    Critical Care performed:  None ____________________________________________   INITIAL IMPRESSION / ASSESSMENT AND PLAN / ED COURSE  82 y.o. male with a history of dementia, sick sinus syndrome, prostate cancer, DVT who presents for evaluation after an unwitnessed fall with a scalp laceration. He is on hospice care and comfort care. I attempted to contact his niece, Elsworth Soho, patient's HCPOA and left her a message. Will hold off on further workup until speaking to her at this time. Will repair laceration and get head CT/ CT cervical spine. Tetanus vaccination is up-to-date as patient received a booster on 08/21/15  ----------------------------------------- 6:35 PM on 08/31/2015 -----------------------------------------  I spoke with Miss Celesta Aver, patient's healthcare power of attorney who requested that we do not do any further evaluation other than imaging to rule out a fracture or head bleed and repair his laceration. Since he is DNR/DNI and is on hospice care she would like first not to pursue any further workup.  ----------------------------------------- 7:57 PM on 08/31/2015 -----------------------------------------  CT head and cervical spine were no acute findings. Laceration was repaired. Please see procedure note above. Patient discharged back to his facility.  Pertinent labs & imaging results that were available during my care of the patient were reviewed by me and considered in my medical decision making (see chart for details).   I discussed my evaluation of the patient's symptoms, my clinical impression, and my proposed outpatient treatment plan with patient/ family members. We have discussed anticipatory guidance, scheduled follow-up, and careful return precautions. The patient expresses understanding and is comfortable with the discharge plan. All patient's questions were  answered.    ____________________________________________   FINAL CLINICAL  IMPRESSION(S) / ED DIAGNOSES  Final diagnoses:  Fall, initial encounter  Scalp laceration, initial encounter      NEW MEDICATIONS STARTED DURING THIS VISIT:  New Prescriptions   No medications on file     Note:  This document was prepared using Dragon voice recognition software and may include unintentional dictation errors.    Rudene Re, MD 08/31/15 1958

## 2015-08-31 NOTE — Progress Notes (Signed)
Follow up visit to new referral for [facility] services at [facility] following discharge. Patient seen sleeping, safety sitter at bedside.She reports he has been asleep most of the day, very little oral intake. He did require PRN lorazepam overnight for agitation. Plan remains for patient to discharge today via EMS back to [facility]. Updated patient information faxed to referral. Thank you. Flo Shanks RN, BSN, [facility] [place], [facility] [phone] c

## 2015-09-21 ENCOUNTER — Ambulatory Visit: Payer: Medicare Other | Admitting: Cardiovascular Disease

## 2015-09-25 DEATH — deceased
